# Patient Record
Sex: Female | Born: 1960 | Race: Black or African American | Hispanic: No | Marital: Married | Smoking: Never smoker
Health system: Southern US, Community
[De-identification: ages and names within clinical notes are randomized; demographics above are authoritative.]

## PROBLEM LIST (undated history)

## (undated) DIAGNOSIS — M1990 Unspecified osteoarthritis, unspecified site: Secondary | ICD-10-CM

## (undated) DIAGNOSIS — D649 Anemia, unspecified: Secondary | ICD-10-CM

## (undated) DIAGNOSIS — M810 Age-related osteoporosis without current pathological fracture: Secondary | ICD-10-CM

## (undated) DIAGNOSIS — H11009 Unspecified pterygium of unspecified eye: Secondary | ICD-10-CM

## (undated) DIAGNOSIS — I10 Essential (primary) hypertension: Secondary | ICD-10-CM

## (undated) HISTORY — DX: Essential (primary) hypertension: I10

## (undated) HISTORY — PX: MYOMECTOMY: SHX85

## (undated) HISTORY — PX: APPENDECTOMY: SHX54

## (undated) HISTORY — DX: Unspecified pterygium of unspecified eye: H11.009

---
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
## 1999-08-29 ENCOUNTER — Other Ambulatory Visit: Admission: RE | Admit: 1999-08-29 | Discharge: 1999-08-29 | Payer: Self-pay | Admitting: Obstetrics and Gynecology

## 2000-09-03 ENCOUNTER — Other Ambulatory Visit: Admission: RE | Admit: 2000-09-03 | Discharge: 2000-09-03 | Payer: Self-pay | Admitting: Obstetrics and Gynecology

## 2000-09-20 ENCOUNTER — Encounter: Payer: Self-pay | Admitting: Obstetrics and Gynecology

## 2000-09-20 ENCOUNTER — Ambulatory Visit (HOSPITAL_COMMUNITY): Admission: RE | Admit: 2000-09-20 | Discharge: 2000-09-20 | Payer: Self-pay | Admitting: *Deleted

## 2001-01-02 ENCOUNTER — Ambulatory Visit (HOSPITAL_COMMUNITY): Admission: RE | Admit: 2001-01-02 | Discharge: 2001-01-02 | Payer: Self-pay | Admitting: Obstetrics and Gynecology

## 2001-04-24 ENCOUNTER — Inpatient Hospital Stay (HOSPITAL_COMMUNITY): Admission: AD | Admit: 2001-04-24 | Discharge: 2001-04-24 | Payer: Self-pay | Admitting: Obstetrics and Gynecology

## 2001-06-24 ENCOUNTER — Inpatient Hospital Stay (HOSPITAL_COMMUNITY): Admission: AD | Admit: 2001-06-24 | Discharge: 2001-06-24 | Payer: Self-pay | Admitting: Obstetrics and Gynecology

## 2001-07-22 ENCOUNTER — Inpatient Hospital Stay (HOSPITAL_COMMUNITY): Admission: AD | Admit: 2001-07-22 | Discharge: 2001-07-22 | Payer: Self-pay | Admitting: Obstetrics and Gynecology

## 2001-09-06 ENCOUNTER — Inpatient Hospital Stay (HOSPITAL_COMMUNITY): Admission: AD | Admit: 2001-09-06 | Discharge: 2001-09-06 | Payer: Self-pay | Admitting: Obstetrics and Gynecology

## 2002-01-24 ENCOUNTER — Other Ambulatory Visit: Admission: RE | Admit: 2002-01-24 | Discharge: 2002-01-24 | Payer: Self-pay | Admitting: Obstetrics and Gynecology

## 2003-02-27 ENCOUNTER — Other Ambulatory Visit: Admission: RE | Admit: 2003-02-27 | Discharge: 2003-02-27 | Payer: Self-pay | Admitting: Obstetrics and Gynecology

## 2003-12-10 ENCOUNTER — Encounter: Admission: RE | Admit: 2003-12-10 | Discharge: 2003-12-10 | Payer: Self-pay | Admitting: Internal Medicine

## 2004-03-14 ENCOUNTER — Other Ambulatory Visit: Admission: RE | Admit: 2004-03-14 | Discharge: 2004-03-14 | Payer: Self-pay | Admitting: Obstetrics and Gynecology

## 2005-03-22 ENCOUNTER — Other Ambulatory Visit: Admission: RE | Admit: 2005-03-22 | Discharge: 2005-03-22 | Payer: Self-pay | Admitting: Obstetrics and Gynecology

## 2006-12-17 ENCOUNTER — Encounter: Admission: RE | Admit: 2006-12-17 | Discharge: 2006-12-17 | Payer: Self-pay | Admitting: Internal Medicine

## 2010-07-31 HISTORY — PX: COLONOSCOPY: SHX174

## 2010-11-25 LAB — HM COLONOSCOPY

## 2010-12-16 NOTE — H&P (Signed)
[facility]  Patient:    Dana Hays, Dana Hays                         MRN: [id number] Adm. Date:  01/02/01 Attending:  Duke Salvia. Marcelle Overlie, M.D.                         History and Physical  CHIEF COMPLAINT:              Primary infertility.  HISTORY OF PRESENT ILLNESS:   A 50 year old G0, P0 has been off OCPs since November 2000 without conception.  She has a history of a multiple myomectomy in 1995.  This was done primarily due to abnormal uterine bleeding.  At the time in 1995 she had a 3 cm intramural fibroid removed and two posterior wall 3 cm fibroids removed.  The adnexal anatomy at the time appeared to be normal. Since coming off OCPs and failing to conceive, she had a mid luteal screening progesterone of 14.7 and recently underwent HSG that showed bilateral proximal obstruction.  She is admitted now for diagnostic laparoscopy with tubal dye studies.  This procedure including risks of bleeding, infection, adjacent organ injury, the possible need for open or additional surgery all discussed with her.  Laparoscopic techniques for lysis of adhesions or treatment of endometriosis reviewed also.  ALLERGIES:                    None.  PAST SURGICAL HISTORY:        Myomectomy.  REVIEW OF SYSTEMS:            Otherwise unremarkable.  CURRENT MEDICATIONS:          None.  PHYSICAL EXAMINATION  VITAL SIGNS:                  Temperature 98.2, blood pressure 128/80.  HEENT:                        Unremarkable.  NECK:                         Supple without mass.  LUNGS:                        Clear.  CARDIOVASCULAR:               Regular rate and rhythm without murmurs, rubs, or gallops noted.  BREASTS:                      Without masses.  ABDOMEN:                      Soft, flat, nontender.  PELVIC:                       Normal external genitalia.  Vagina and cervix: Clear.  Uterus:  Mid position, normal size.  No new fibroids were  palpated. Adnexa:  Unremarkable.  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  Patient with a history of prior myomectomy in 1995.  PLAN:  Diagnostic laparoscopy with tubal dye studies. Procedure and risks discussed as above. DD:  01/01/01 TD:  01/01/01 Job: [id number]

## 2010-12-16 NOTE — Op Note (Signed)
[facility]  Patient:    Dana Hays, Dana Hays                         MRN: [id number] Proc. Date: 01/01/01 Adm. Date:  [id number] Attending:  Rhina Brackett                           Operative Report  PREOPERATIVE DIAGNOSIS:  Primary infertility.  POSTOPERATIVE DIAGNOSIS:  Primary infertility.  PROCEDURE:  Diagnostic laparoscopy with tubal dye study.  SURGEON:  Dr. Marcelle Overlie.  ANESTHESIA:  General endotracheal.  COMPLICATIONS:  None.  DRAINS:  In and out Foley catheter.  ESTIMATED BLOOD LOSS:  Less than 5 cc.  DESCRIPTION OF PROCEDURE:  The patient went to the operating room after an adequate level of general endotracheal anesthesia was obtained. With her legs in stirrups, the perineum and vagina were prepped and draped in the usual manner for a laparoscopy. The bladder was drained, EUA carried out. The uterus was mobile, normal size, mid position, adnexa negative. The ______ cannula was then positioned. Attention directed to the abdomen where a 2 cm subumbilical incision was made. The Veress needle was introduced without difficulty, its infraabdominal position was verified by pressure and water testing. After a 2 liter pneumoperitoneum was then created, laparoscopic trocar and ______ then introduced without difficulty. There was no evidence of any bleeding or trauma. Three to four finger breadths above the symphysis in the midline, a second contour was made a 5 mm trocar was inserted under direct visualization and a blunt probe was inserted. The patient was then placed in Trendelenburg and the pelvic findings as follows.  The anterior and posterior cul-de-sac spaces were free and clear, the uterus was felt and was normal size. There was no evidence of adhesions after her prior myomectomy. She had one new fibroid that was approximately a centimeter and a half on the right posterior fundal area, no others were noted. The right tube and ovary were  entirely normal. The fimbriated end was delicate without evidence of adhesions. Again the cul-de-sac was free and clear. There was no evidence of any adhesions or endometriosis. On the left side, the left ovary was normal but the left tube was somewhat tortuous although the fimbriated end appeared to be normal. She had had a prior appendectomy. The upper abdominal findings were normal. After these findings were noted, dilute methylene blue was insufflated. There was initial resistance to injection but this was quickly overcome and prompt spillage noted from both sides. Continued lavage was carried out with easy flow on both sides. Once patency was established on either side, the suction irrigator was used to irrigate the dilute methylene blue solution out. Once the irrigation was carried out, no further abnormal findings were noted. She did received Cefotan 1 gm IV preoperatively. Instruments were removed, gas allowed to escape, the defects closed with 4-0 Dexon subcuticular sutures. She tolerated this well and went to the recovery room in good condition. DD:  01/02/01 TD:  01/02/01 Job: [id number]

## 2016-11-20 LAB — HM PAP SMEAR: HM Pap smear: NEGATIVE

## 2018-05-09 ENCOUNTER — Ambulatory Visit (INDEPENDENT_AMBULATORY_CARE_PROVIDER_SITE_OTHER): Payer: 59 | Admitting: Family Medicine

## 2018-05-09 ENCOUNTER — Encounter: Payer: Self-pay | Admitting: Family Medicine

## 2018-05-09 VITALS — BP 140/82 | HR 68 | Temp 97.8°F | Ht 64.5 in | Wt 128.4 lb

## 2018-05-09 DIAGNOSIS — L42 Pityriasis rosea: Secondary | ICD-10-CM

## 2018-05-09 DIAGNOSIS — R21 Rash and other nonspecific skin eruption: Secondary | ICD-10-CM | POA: Diagnosis not present

## 2018-05-09 NOTE — Patient Instructions (Addendum)
Take a nondrowsy antihistamine such as Claritin, Xyzal, Allegra, or Zyrtec.  You can use Benadryl at night if needed.   Take cool baths or showers and use cool compresses as needed for itching.   Pityriasis Rosea Pityriasis rosea is a rash that usually appears on the trunk of the body. It may also appear on the upper arms and upper legs. It usually begins as a single patch, and then more patches begin to develop. The rash may cause mild itching, but it normally does not cause other problems. It usually goes away without treatment. However, it may take weeks or months for the rash to go away completely. What are the causes? The cause of this condition is not known. The condition does not spread from person to person (is noncontagious). What increases the risk? This condition is more likely to develop in young adults and children. It is most common in the spring and fall. What are the signs or symptoms? The main symptom of this condition is a rash.  The rash usually begins with a single oval patch that is larger than the ones that follow. This is called a herald patch. It generally appears a week or more before the rest of the rash appears.  When more patches start to develop, they spread quickly on the trunk, back, and arms. These patches are smaller than the first one.  The patches that make up the rash are usually oval-shaped and pink or red in color. They are usually flat, but they may sometimes be raised so that they can be felt with a finger. They may also be finely crinkled and have a scaly ring around the edge.  The rash does not typically appear on areas of the skin that are exposed to the sun.  Most people who have this condition do not have other symptoms, but some have mild itching. In a few cases, a mild headache or body aches may occur before the rash appears and then go away. How is this diagnosed? Your health care provider may diagnose this condition by doing a physical exam and  taking your medical history. To rule out other possible causes for the rash, the health care provider may order blood tests or take a skin sample from the rash to be looked at under a microscope. How is this treated? Usually, treatment is not needed for this condition. The rash will probably go away on its own in 4-8 weeks. In some cases, a health care provider may recommend or prescribe medicine to reduce itching. Follow these instructions at home:  Take medicines only as directed by your health care provider.  Avoid scratching the affected areas of skin.  Do not take hot baths or use a sauna. Use only warm water when bathing or showering. Heat can increase itching. Contact a health care provider if:  Your rash does not go away in 8 weeks.  Your rash gets much worse.  You have a fever.  You have swelling or pain in the rash area.  You have fluid, blood, or pus coming from the rash area. This information is not intended to replace advice given to you by your health care provider. Make sure you discuss any questions you have with your health care provider. Document Released: 08/23/2001 Document Revised: 12/23/2015 Document Reviewed: 06/24/2014 Elsevier Interactive Patient Education  [employer].

## 2018-05-09 NOTE — Progress Notes (Signed)
   Subjective:    Patient ID: Dana Hays, female    DOB: [birth year], 57 y.o.   MRN: [id number]  HPI Chief Complaint  Patient presents with  . new pt    rash for the last 2 months, used otc meds, then went to another provider to get meds.    She is new to the practice and here for an acute complaint.   Previous medical care: Dr. Lorene Dy  Complains of a rash started on her right thigh 2 months ago. She thought it was a mosquito bite. Since then she has noticed additional red bumps to her torso, upper and lower extremities and slightly pruritic.   States she has been evaluated by at least 2 other providers and has tried topical antihistamine, oral steroids, topical steroid and antifungal creams without any relief.   Denies fever, chills, headache, dizziness, chest pain, palpitations, abdominal pain, N/V/D. No arthralgias or myalgias.   Reviewed allergies, medications, past medical, surgical, family, and social history.     Review of Systems Pertinent positives and negatives in the history of present illness.     Objective:   Physical Exam BP 140/82   Pulse 68   Temp 97.8 F (36.6 C) (Oral)   Ht 5' 4.5" (1.638 m)   Wt 128 lb 6.4 oz (58.2 kg)   SpO2 98%   BMI 21.70 kg/m   Alert and oriented and in no acute distress. Multiple areas of oval lesions that are erythematous and scaly on torso and extremities. Rash on back is in a christmas tree type pattern. No sign of infection.       Assessment & Plan:  PR (pityriasis rosea)  Rash  Discussed that this appears to be related to a viral illness called pityriasis. Dr. Redmond School also examined patient. The initial patch on her thigh was most likely the herald patch. She has an upcoming appointment with a dermatologist and encouraged her to keep this appointment. Discussed trying a daily non drowsy antihistamine to help control itching and to take cool showers although she denies itching interfering with her daily activities  or sleep

## 2018-05-10 ENCOUNTER — Encounter: Payer: Self-pay | Admitting: Family Medicine

## 2018-07-15 ENCOUNTER — Telehealth: Payer: Self-pay | Admitting: Family Medicine

## 2018-07-15 NOTE — Telephone Encounter (Signed)
Called the office of Dr. Lorene Dy in inquire about records that we have been requesting since October. Those records are ready but their office policy requires the pt pick up at their office. They have attempted to reach pt. I also called and was unable to leave a message.

## 2018-12-25 ENCOUNTER — Telehealth: Payer: Self-pay | Admitting: Family Medicine

## 2018-12-25 LAB — HM DEXA SCAN

## 2018-12-25 NOTE — Telephone Encounter (Signed)
Left message for pt to call and schedule a wellness exam

## 2019-03-02 NOTE — Progress Notes (Signed)
Subjective:    Patient ID: Dana Hays, female    DOB: 03/17/1961, 58 y.o.   MRN: [id number]  HPI Chief Complaint  Patient presents with  . Annual Exam    concenrd bp medication may not be working    She is fairly new to the practice and here for a complete physical exam. Last CPE: 01/2018  Lorene Dy was her previous PCP.  She did bring in paper charting today from his office including lab results.  HTN- Concerned that her BP medication is not working.  States she has had hypertension for years and has been on the current lisinopril-HCTZ 10-12.5 mg dose the entire time.  Denies ever being on any other medication for hypertension.  Denies eating a lot of sodium.  Diclofenac for back pain, sporadically   Other providers: OB/GYN- Dr. Currie Paris  Social history: Lives with husband, works as  Denies smoking, drinking alcohol, drug use  Diet: healthy  Excerise: very active, bike riding   Immunizations: unknown.  She does have records from previous PCP and will review these for deficiencies.  Health maintenance:  Mammogram: May 2020 Colonoscopy: 2012 at [facility] and due for recall in 2022 Last Gynecological Exam: May 2020  Last Menstrual cycle: age 33  DEXA: May 2020  Last Dental Exam: July 2020 Last Eye Exam: years ago   Wears seatbelt always, smoke detectors in home and functioning, does not text while driving and feels safe in home environment.   Reviewed allergies, medications, past medical, surgical, family, and social history.   Review of Systems Review of Systems Constitutional: -fever, -chills, -sweats, -unexpected weight change,-fatigue ENT: -runny nose, -ear pain, -sore throat Cardiology:  -chest pain, -palpitations, -edema Respiratory: -cough, -shortness of breath, -wheezing Gastroenterology: -abdominal pain, -nausea, -vomiting, -diarrhea, -constipation  Hematology: -bleeding or bruising problems Musculoskeletal: -arthralgias, -myalgias, -joint  swelling, -back pain Ophthalmology: -vision changes Urology: -dysuria, -difficulty urinating, -hematuria, -urinary frequency, -urgency Neurology: -headache, -weakness, -tingling, -numbness       Objective:   Physical Exam BP (!) 132/98   Pulse 64   Temp 98.2 F (36.8 C) (Oral)   Ht 5\' 5"  (1.651 m)   Wt 123 lb 12.8 oz (56.2 kg)   SpO2 99%   BMI 20.60 kg/m   General Appearance:    Alert, cooperative, no distress, appears stated age  Head:    Normocephalic, without obvious abnormality, atraumatic  Eyes:    PERRL, conjunctiva/corneas clear, EOM's intact, fundi    benign  Ears:    Normal TM's and external ear canals  Nose:  Mask in place   Throat:  Mask in place   Neck:   Supple, no lymphadenopathy;  thyroid:  no   enlargement/tenderness/nodules; no carotid   bruit or JVD  Back:    Spine nontender, no curvature, ROM normal, no CVA     tenderness  Lungs:     Clear to auscultation bilaterally without wheezes, rales or     ronchi; respirations unlabored  Chest Wall:    No tenderness or deformity   Heart:    Regular rate and rhythm, S1 and S2 normal, no murmur, rub   or gallop  Breast Exam:    OB/GYN  Abdomen:     Soft, non-tender, nondistended, normoactive bowel sounds,    no masses, no hepatosplenomegaly  Genitalia:    OB/GYN     Extremities:   No clubbing, cyanosis or edema  Pulses:   2+ and symmetric all extremities  Skin:  Skin color, texture, turgor normal, no rashes or lesions  Lymph nodes:   Cervical, supraclavicular, and axillary nodes normal  Neurologic:   CNII-XII intact, normal strength, sensation and gait; reflexes 2+ and symmetric throughout          Psych:   Normal mood, affect, hygiene and grooming.    UA dipstick- negative      Assessment & Plan:  Routine general medical examination at a health care facility - Plan: CBC with Differential/Platelet, Comprehensive metabolic panel, TSH, T4, free, Lipid panel, POCT Urinalysis DIP (Proadvantage Device).  She is  here today for a fasting CPE and appears to be doing quite well.  She is taking good care of herself with a healthy diet and frequent exercise.  Reports being up-to-date with Pap smear, mammogram and bone density.  I will request these from her OB/GYN.  Immunization counseling done.  She will review immunizations from her previous PCP and let me know if she is overdue.  Essential hypertension - Plan: lisinopril-hydrochlorothiazide (ZESTORETIC) 20-12.5 MG tablet.  Her blood pressure has not been well controlled for some time.  She checks her blood pressure at home.  Counseling done on low-sodium diet.  She currently does exercise often and will keep this up.  I will increase her lisinopril dose from 10 mg to 20 mg and she will continue on 12.5 mg of HCTZ.  She will let me know if her blood pressures are not improving and will follow-up here in 4 weeks.

## 2019-03-03 ENCOUNTER — Encounter: Payer: Self-pay | Admitting: Family Medicine

## 2019-03-03 ENCOUNTER — Other Ambulatory Visit: Payer: Self-pay

## 2019-03-03 ENCOUNTER — Ambulatory Visit (INDEPENDENT_AMBULATORY_CARE_PROVIDER_SITE_OTHER): Payer: BC Managed Care – PPO | Admitting: Family Medicine

## 2019-03-03 VITALS — BP 132/98 | HR 64 | Temp 98.2°F | Ht 65.0 in | Wt 123.8 lb

## 2019-03-03 DIAGNOSIS — I10 Essential (primary) hypertension: Secondary | ICD-10-CM | POA: Insufficient documentation

## 2019-03-03 DIAGNOSIS — Z0000 Encounter for general adult medical examination without abnormal findings: Secondary | ICD-10-CM

## 2019-03-03 LAB — POCT URINALYSIS DIP (PROADVANTAGE DEVICE)
Bilirubin, UA: NEGATIVE
Blood, UA: NEGATIVE
Glucose, UA: NEGATIVE mg/dL
Ketones, POC UA: NEGATIVE mg/dL
Leukocytes, UA: NEGATIVE
Nitrite, UA: NEGATIVE
Protein Ur, POC: NEGATIVE mg/dL
Specific Gravity, Urine: 1.015
Urobilinogen, Ur: NEGATIVE
pH, UA: 6 (ref 5.0–8.0)

## 2019-03-03 MED ORDER — LISINOPRIL-HYDROCHLOROTHIAZIDE 20-12.5 MG PO TABS: 1.0000 | ORAL_TABLET | Freq: Every day | ORAL | 2 refills | Status: DC

## 2019-03-03 NOTE — Patient Instructions (Addendum)
Your BP is not well controlled. Goal BP is <130/80.   I am sending in a new prescription to your pharmacy and increasing your lisinopril dose from 10 mg to 20 mg.  Continue eating a low sodium diet and exercising.   Continue checking your BP at home.   Follow up in 4 weeks.    DASH Eating Plan DASH stands for "Dietary Approaches to Stop Hypertension." The DASH eating plan is a healthy eating plan that has been shown to reduce high blood pressure (hypertension). It may also reduce your risk for type 2 diabetes, heart disease, and stroke. The DASH eating plan may also help with weight loss. What are tips for following this plan?  General guidelines  Avoid eating more than 2,300 mg (milligrams) of salt (sodium) a day. If you have hypertension, you may need to reduce your sodium intake to 1,500 mg a day.  Limit alcohol intake to no more than 1 drink a day for nonpregnant women and 2 drinks a day for men. One drink equals 12 oz of beer, 5 oz of wine, or 1 oz of hard liquor.  Work with your health care provider to maintain a healthy body weight or to lose weight. Ask what an ideal weight is for you.  Get at least 30 minutes of exercise that causes your heart to beat faster (aerobic exercise) most days of the week. Activities may include walking, swimming, or biking.  Work with your health care provider or diet and nutrition specialist (dietitian) to adjust your eating plan to your individual calorie needs. Reading food labels   Check food labels for the amount of sodium per serving. Choose foods with less than 5 percent of the Daily Value of sodium. Generally, foods with less than 300 mg of sodium per serving fit into this eating plan.  To find whole grains, look for the word "whole" as the first word in the ingredient list. Shopping  Buy products labeled as "low-sodium" or "no salt added."  Buy fresh foods. Avoid canned foods and premade or frozen meals. Cooking  Avoid adding salt  when cooking. Use salt-free seasonings or herbs instead of table salt or sea salt. Check with your health care provider or pharmacist before using salt substitutes.  Do not fry foods. Cook foods using healthy methods such as baking, boiling, grilling, and broiling instead.  Cook with heart-healthy oils, such as olive, canola, soybean, or sunflower oil. Meal planning  Eat a balanced diet that includes: ? 5 or more servings of fruits and vegetables each day. At each meal, try to fill half of your plate with fruits and vegetables. ? Up to 6-8 servings of whole grains each day. ? Less than 6 oz of lean meat, poultry, or fish each day. A 3-oz serving of meat is about the same size as a deck of cards. One egg equals 1 oz. ? 2 servings of low-fat dairy each day. ? A serving of nuts, seeds, or beans 5 times each week. ? Heart-healthy fats. Healthy fats called Omega-3 fatty acids are found in foods such as flaxseeds and coldwater fish, like sardines, salmon, and mackerel.  Limit how much you eat of the following: ? Canned or prepackaged foods. ? Food that is high in trans fat, such as fried foods. ? Food that is high in saturated fat, such as fatty meat. ? Sweets, desserts, sugary drinks, and other foods with added sugar. ? Full-fat dairy products.  Do not salt foods before eating.  Try to eat at least 2 vegetarian meals each week.  Eat more home-cooked food and less restaurant, buffet, and fast food.  When eating at a restaurant, ask that your food be prepared with less salt or no salt, if possible. What foods are recommended? The items listed may not be a complete list. Talk with your dietitian about what dietary choices are Pillars for you. Grains Whole-grain or whole-wheat bread. Whole-grain or whole-wheat pasta. Brown rice. [place]. Bulgur. Whole-grain and low-sodium cereals. Pita bread. Low-fat, low-sodium crackers. Whole-wheat flour tortillas. Vegetables Fresh or frozen  vegetables (raw, steamed, roasted, or grilled). Low-sodium or reduced-sodium tomato and vegetable juice. Low-sodium or reduced-sodium tomato sauce and tomato paste. Low-sodium or reduced-sodium canned vegetables. Fruits All fresh, dried, or frozen fruit. Canned fruit in natural juice (without added sugar). Meat and other protein foods Skinless chicken or [place]. Ground chicken or [place]. Pork with fat trimmed off. Fish and seafood. Egg whites. Dried beans, peas, or lentils. Unsalted nuts, nut butters, and seeds. Unsalted canned beans. Lean cuts of beef with fat trimmed off. Low-sodium, lean deli meat. Dairy Low-fat (1%) or fat-free (skim) milk. Fat-free, low-fat, or reduced-fat cheeses. Nonfat, low-sodium ricotta or cottage cheese. Low-fat or nonfat yogurt. Low-fat, low-sodium cheese. Fats and oils Soft margarine without trans fats. Vegetable oil. Low-fat, reduced-fat, or light mayonnaise and salad dressings (reduced-sodium). Canola, safflower, olive, soybean, and sunflower oils. Avocado. Seasoning and other foods Herbs. Spices. Seasoning mixes without salt. Unsalted popcorn and pretzels. Fat-free sweets. What foods are not recommended? The items listed may not be a complete list. Talk with your dietitian about what dietary choices are [place] for you. Grains Baked goods made with fat, such as croissants, muffins, or some breads. Dry pasta or rice meal packs. Vegetables Creamed or fried vegetables. Vegetables in a cheese sauce. Regular canned vegetables (not low-sodium or reduced-sodium). Regular canned tomato sauce and paste (not low-sodium or reduced-sodium). Regular tomato and vegetable juice (not low-sodium or reduced-sodium). Angie Fava. Olives. Fruits Canned fruit in a light or heavy syrup. Fried fruit. Fruit in cream or butter sauce. Meat and other protein foods Fatty cuts of meat. Ribs. Fried meat. Berniece Salines. Sausage. Bologna and other processed lunch meats. Salami. Fatback. Hotdogs. Bratwurst.  Salted nuts and seeds. Canned beans with added salt. Canned or smoked fish. Whole eggs or egg yolks. Chicken or [place] with skin. Dairy Whole or 2% milk, cream, and half-and-half. Whole or full-fat cream cheese. Whole-fat or sweetened yogurt. Full-fat cheese. Nondairy creamers. Whipped toppings. Processed cheese and cheese spreads. Fats and oils Butter. Stick margarine. Lard. Shortening. Ghee. Bacon fat. Tropical oils, such as coconut, palm kernel, or palm oil. Seasoning and other foods Salted popcorn and pretzels. Onion salt, garlic salt, seasoned salt, table salt, and sea salt. Worcestershire sauce. Tartar sauce. Barbecue sauce. Teriyaki sauce. Soy sauce, including reduced-sodium. Steak sauce. Canned and packaged gravies. Fish sauce. Oyster sauce. Cocktail sauce. Horseradish that you find on the shelf. Ketchup. Mustard. Meat flavorings and tenderizers. Bouillon cubes. Hot sauce and Tabasco sauce. Premade or packaged marinades. Premade or packaged taco seasonings. Relishes. Regular salad dressings. Where to find more information:  National Heart, Lung, and [place]: https://wilson-eaton.com/  American Heart Association: www.heart.org Summary  The DASH eating plan is a healthy eating plan that has been shown to reduce high blood pressure (hypertension). It may also reduce your risk for type 2 diabetes, heart disease, and stroke.  With the DASH eating plan, you should limit salt (sodium) intake to 2,300 mg a day. If  you have hypertension, you may need to reduce your sodium intake to 1,500 mg a day.  When on the DASH eating plan, aim to eat more fresh fruits and vegetables, whole grains, lean proteins, low-fat dairy, and heart-healthy fats.  Work with your health care provider or diet and nutrition specialist (dietitian) to adjust your eating plan to your individual calorie needs. This information is not intended to replace advice given to you by your health care provider. Make sure you discuss any  questions you have with your health care provider. Document Released: 07/06/2011 Document Revised: 06/29/2017 Document Reviewed: 07/10/2016 Elsevier Patient Education  2020 Elsevier Inc.   Preventive Care 60-49 Years Old, Female Preventive care refers to visits with your health care provider and lifestyle choices that can promote health and wellness. This includes:  A yearly physical exam. This may also be called an annual well check.  Regular dental visits and eye exams.  Immunizations.  Screening for certain conditions.  Healthy lifestyle choices, such as eating a healthy diet, getting regular exercise, not using drugs or products that contain nicotine and tobacco, and limiting alcohol use. What can I expect for my preventive care visit? Physical exam Your health care provider will check your:  Height and weight. This may be used to calculate body mass index (BMI), which tells if you are at a healthy weight.  Heart rate and blood pressure.  Skin for abnormal spots. Counseling Your health care provider may ask you questions about your:  Alcohol, tobacco, and drug use.  Emotional well-being.  Home and relationship well-being.  Sexual activity.  Eating habits.  Work and work Statistician.  Method of birth control.  Menstrual cycle.  Pregnancy history. What immunizations do I need?  Influenza (flu) vaccine  This is recommended every year. Tetanus, diphtheria, and pertussis (Tdap) vaccine  You may need a Td booster every 10 years. Varicella (chickenpox) vaccine  You may need this if you have not been vaccinated. Zoster (shingles) vaccine  You may need this after age 78. Measles, mumps, and rubella (MMR) vaccine  You may need at least one dose of MMR if you were born in 1957 or later. You may also need a second dose. Pneumococcal conjugate (PCV13) vaccine  You may need this if you have certain conditions and were not previously vaccinated. Pneumococcal  polysaccharide (PPSV23) vaccine  You may need one or two doses if you smoke cigarettes or if you have certain conditions. Meningococcal conjugate (MenACWY) vaccine  You may need this if you have certain conditions. Hepatitis A vaccine  You may need this if you have certain conditions or if you travel or work in places where you may be exposed to hepatitis A. Hepatitis B vaccine  You may need this if you have certain conditions or if you travel or work in places where you may be exposed to hepatitis B. Haemophilus influenzae type b (Hib) vaccine  You may need this if you have certain conditions. Human papillomavirus (HPV) vaccine  If recommended by your health care provider, you may need three doses over 6 months. You may receive vaccines as individual doses or as more than one vaccine together in one shot (combination vaccines). Talk with your health care provider about the risks and benefits of combination vaccines. What tests do I need? Blood tests  Lipid and cholesterol levels. These may be checked every 5 years, or more frequently if you are over 45 years old.  Hepatitis C test.  Hepatitis B test. Screening  Lung cancer screening. You may have this screening every year starting at age 73 if you have a 30-pack-year history of smoking and currently smoke or have quit within the past 15 years.  Colorectal cancer screening. All adults should have this screening starting at age 47 and continuing until age 27. Your health care provider may recommend screening at age 66 if you are at increased risk. You will have tests every 1-10 years, depending on your results and the type of screening test.  Diabetes screening. This is done by checking your blood sugar (glucose) after you have not eaten for a while (fasting). You may have this done every 1-3 years.  Mammogram. This may be done every 1-2 years. Talk with your health care provider about when you should start having regular  mammograms. This may depend on whether you have a family history of breast cancer.  BRCA-related cancer screening. This may be done if you have a family history of breast, ovarian, tubal, or peritoneal cancers.  Pelvic exam and Pap test. This may be done every 3 years starting at age 74. Starting at age 27, this may be done every 5 years if you have a Pap test in combination with an HPV test. Other tests  Sexually transmitted disease (STD) testing.  Bone density scan. This is done to screen for osteoporosis. You may have this scan if you are at high risk for osteoporosis. Follow these instructions at home: Eating and drinking  Eat a diet that includes fresh fruits and vegetables, whole grains, lean protein, and low-fat dairy.  Take vitamin and mineral supplements as recommended by your health care provider.  Do not drink alcohol if: ? Your health care provider tells you not to drink. ? You are pregnant, may be pregnant, or are planning to become pregnant.  If you drink alcohol: ? Limit how much you have to 0-1 drink a day. ? Be aware of how much alcohol is in your drink. In the [place] one drink equals one 12 oz bottle of beer (355 mL), one 5 oz glass of wine (148 mL), or one 1 oz glass of hard liquor (44 mL). Lifestyle  Take daily care of your teeth and gums.  Stay active. Exercise for at least 30 minutes on 5 or more days each week.  Do not use any products that contain nicotine or tobacco, such as cigarettes, e-cigarettes, and chewing tobacco. If you need help quitting, ask your health care provider.  If you are sexually active, practice safe sex. Use a condom or other form of birth control (contraception) in order to prevent pregnancy and STIs (sexually transmitted infections).  If told by your health care provider, take low-dose aspirin daily starting at age 11. What's next?  Visit your health care provider once a year for a well check visit.  Ask your health care provider  how often you should have your eyes and teeth checked.  Stay up to date on all vaccines. This information is not intended to replace advice given to you by your health care provider. Make sure you discuss any questions you have with your health care provider. Document Released: 08/13/2015 Document Revised: 03/28/2018 Document Reviewed: 03/28/2018 Elsevier Patient Education  2020 [employer].

## 2019-03-04 ENCOUNTER — Encounter: Payer: Self-pay | Admitting: Family Medicine

## 2019-03-04 LAB — CBC WITH DIFFERENTIAL/PLATELET
Basophils Absolute: 0.1 10*3/uL (ref 0.0–0.2)
Basos: 1 %
EOS (ABSOLUTE): 0.1 10*3/uL (ref 0.0–0.4)
Eos: 1 %
Hematocrit: 39.8 % (ref 34.0–46.6)
Hemoglobin: 13.1 g/dL (ref 11.1–15.9)
Immature Grans (Abs): 0 10*3/uL (ref 0.0–0.1)
Immature Granulocytes: 0 %
Lymphocytes Absolute: 1.8 10*3/uL (ref 0.7–3.1)
Lymphs: 43 %
MCH: 30 pg (ref 26.6–33.0)
MCHC: 32.9 g/dL (ref 31.5–35.7)
MCV: 91 fL (ref 79–97)
Monocytes Absolute: 0.3 10*3/uL (ref 0.1–0.9)
Monocytes: 8 %
Neutrophils Absolute: 2 10*3/uL (ref 1.4–7.0)
Neutrophils: 47 %
Platelets: 207 10*3/uL (ref 150–450)
RBC: 4.37 x10E6/uL (ref 3.77–5.28)
RDW: 13.2 % (ref 11.7–15.4)
WBC: 4.2 10*3/uL (ref 3.4–10.8)

## 2019-03-04 LAB — COMPREHENSIVE METABOLIC PANEL
ALT: 13 IU/L (ref 0–32)
AST: 20 IU/L (ref 0–40)
Albumin/Globulin Ratio: 1.8 (ref 1.2–2.2)
Albumin: 4.7 g/dL (ref 3.8–4.9)
Alkaline Phosphatase: 71 IU/L (ref 39–117)
BUN/Creatinine Ratio: 18 (ref 9–23)
BUN: 17 mg/dL (ref 6–24)
Bilirubin Total: 0.4 mg/dL (ref 0.0–1.2)
CO2: 25 mmol/L (ref 20–29)
Calcium: 9.5 mg/dL (ref 8.7–10.2)
Chloride: 102 mmol/L (ref 96–106)
Creatinine, Ser: 0.92 mg/dL (ref 0.57–1.00)
GFR calc Af Amer: 79 mL/min/{1.73_m2} (ref >59)
GFR calc non Af Amer: 69 mL/min/{1.73_m2} (ref >59)
Globulin, Total: 2.6 g/dL (ref 1.5–4.5)
Glucose: 87 mg/dL (ref 65–99)
Potassium: 3.9 mmol/L (ref 3.5–5.2)
Sodium: 140 mmol/L (ref 134–144)
Total Protein: 7.3 g/dL (ref 6.0–8.5)

## 2019-03-04 LAB — LIPID PANEL
Chol/HDL Ratio: 2.4 ratio (ref 0.0–4.4)
Cholesterol, Total: 178 mg/dL (ref 100–199)
HDL: 75 mg/dL (ref >39)
LDL Calculated: 93 mg/dL (ref 0–99)
Triglycerides: 51 mg/dL (ref 0–149)
VLDL Cholesterol Cal: 10 mg/dL (ref 5–40)

## 2019-03-04 LAB — T4, FREE: Free T4: 1.23 ng/dL (ref 0.82–1.77)

## 2019-03-04 LAB — TSH: TSH: 0.504 u[IU]/mL (ref 0.450–4.500)

## 2019-03-17 ENCOUNTER — Encounter: Payer: Self-pay | Admitting: Family Medicine

## 2019-03-17 DIAGNOSIS — M810 Age-related osteoporosis without current pathological fracture: Secondary | ICD-10-CM

## 2019-03-17 HISTORY — DX: Age-related osteoporosis without current pathological fracture: M81.0

## 2019-03-26 ENCOUNTER — Encounter: Payer: Self-pay | Admitting: Internal Medicine

## 2019-03-26 ENCOUNTER — Telehealth: Payer: Self-pay | Admitting: Family Medicine

## 2019-03-26 NOTE — Telephone Encounter (Signed)
Records received from [facility] 

## 2019-03-31 ENCOUNTER — Encounter: Payer: Self-pay | Admitting: Internal Medicine

## 2019-03-31 ENCOUNTER — Ambulatory Visit (INDEPENDENT_AMBULATORY_CARE_PROVIDER_SITE_OTHER): Payer: BC Managed Care – PPO | Admitting: Family Medicine

## 2019-03-31 ENCOUNTER — Other Ambulatory Visit: Payer: Self-pay

## 2019-03-31 ENCOUNTER — Encounter: Payer: Self-pay | Admitting: Family Medicine

## 2019-03-31 VITALS — BP 130/80 | HR 62 | Wt 125.8 lb

## 2019-03-31 DIAGNOSIS — I10 Essential (primary) hypertension: Secondary | ICD-10-CM | POA: Diagnosis not present

## 2019-03-31 NOTE — Patient Instructions (Signed)

## 2019-03-31 NOTE — Progress Notes (Signed)
   Subjective:    Patient ID: Dana Hays, female    DOB: [birth year], 58 y.o.   MRN: [id number]  HPI Chief Complaint  Patient presents with  . 4 week follow-up    4 week follow-up on BP, bp has running in 130/80 but has come down to 120's   Here today to follow up on HTN. Increased dose of lisinopril on 03/03/2019 due to elevated BP.  BP at home has been in the 120s-130s/70s-80s. No concerns with the medications.   Diet is fairly high in sodium.   Only taking diclofenac sparingly.   Drinks energy drinks 3-4 days a week. She does this before her long bike rides. Bikes 50+ miles usually.   Denies fever, chills, headache, vision changes, dizziness, chest pain, palpitations, shortness of breath, abdominal pain, N/V/D, LE edema.   States she had an EKG last year with her previous PCP, Dr. Mancel Bale. This was sent for scanning but unable to find it today. She reports it being normal.   Will see Dr. Matthew Saras for osteoporosis follow up.   Reviewed allergies, medications, past medical, surgical, family, and social history.   Review of Systems Pertinent positives and negatives in the history of present illness.     Objective:   Physical Exam BP 130/80   Pulse 62   Wt 125 lb 12.8 oz (57.1 kg)   BMI 20.93 kg/m   Alert and oriented and in no acute distress.  Extremities without edema.  Mood is pleasant.  Not otherwise examined.      Assessment & Plan:  Essential hypertension  Blood pressure is better controlled since increasing lisinopril.  Denies any concerns with blood pressure medications.  She is doing a good job checking her blood pressure at home.  Suspect her diet is high in sodium and she will pay closer attention to nutrition labels. Reviewed labs which were all normal from her CPE 4 weeks ago. Unable to locate EKG which was done at her previous PCP at last years physical.  She reports it was normal and I do recall seeing this however, it was sent to be scanned in and  unable to be found in the EMR. No new concerns today.  She will follow-up with her OB/GYN who recently ordered a DEXA which showed osteoporosis. Follow-up in 6 months for hypertension and med check.  I will request EKG from Dr. Mancel Bale office.  Repeat at follow-up if appropriate

## 2019-05-28 ENCOUNTER — Encounter: Payer: Self-pay | Admitting: Family Medicine

## 2019-05-28 DIAGNOSIS — I10 Essential (primary) hypertension: Secondary | ICD-10-CM

## 2019-05-28 MED ORDER — LISINOPRIL-HYDROCHLOROTHIAZIDE 20-12.5 MG PO TABS: 1.0000 | ORAL_TABLET | Freq: Every day | ORAL | 2 refills | Status: DC

## 2019-09-04 ENCOUNTER — Ambulatory Visit (INDEPENDENT_AMBULATORY_CARE_PROVIDER_SITE_OTHER): Payer: BC Managed Care – PPO | Admitting: Family Medicine

## 2019-09-04 ENCOUNTER — Encounter: Payer: Self-pay | Admitting: Family Medicine

## 2019-09-04 ENCOUNTER — Other Ambulatory Visit: Payer: Self-pay

## 2019-09-04 VITALS — BP 150/90 | HR 63 | Temp 97.5°F | Wt 124.0 lb

## 2019-09-04 DIAGNOSIS — I10 Essential (primary) hypertension: Secondary | ICD-10-CM

## 2019-09-04 HISTORY — DX: Essential (primary) hypertension: I10

## 2019-09-04 MED ORDER — AMLODIPINE BESYLATE 5 MG PO TABS: 5.0000 mg | ORAL_TABLET | Freq: Every day | ORAL | 2 refills | Status: DC

## 2019-09-04 MED ORDER — LISINOPRIL-HYDROCHLOROTHIAZIDE 20-12.5 MG PO TABS: 1.0000 | ORAL_TABLET | Freq: Every day | ORAL | 2 refills | Status: DC

## 2019-09-04 NOTE — Progress Notes (Signed)
   Subjective:    Patient ID: Dana Hays, female    DOB: [birth year], 59 y.o.   MRN: [id number]  HPI Chief Complaint  Patient presents with  . med check    med check   Here for a follow up on HTN.  States she does not think her blood pressure medication is working.  States she has been out of her medication for a couple of days.   BP this morning 142/97, 136/92, 137/92   Drinks an energy drink daily. States she eats a healthy diet, not high in sodium.  She exercises regularly.   Denies fever, chills, dizziness, chest pain, palpitations, shortness of breath, abdominal pain, N/V/D, urinary symptoms, LE edema.     Review of Systems Pertinent positives and negatives in the history of present illness.     Objective:   Physical Exam BP (!) 150/90   Pulse 63   Temp (!) 97.5 F (36.4 C)   Wt 124 lb (56.2 kg)   BMI 20.63 kg/m   Alert and oriented and in no distress. Tympanic membranes and canals are normal. Pharyngeal area is normal. Neck is supple without adenopathy or thyromegaly. Cardiac exam shows a regular sinus rhythm without murmurs or gallops. Lungs are clear to auscultation.       Assessment & Plan:  Uncontrolled hypertension - Plan: EKG 12-Lead, amLODipine (NORVASC) 5 MG tablet, lisinopril-hydrochlorothiazide (ZESTORETIC) 20-12.5 MG tablet, CBC with Differential/Platelet, Comprehensive metabolic panel   EKG shows NSR, sinus brady with vent rate 56. Unremarkable. Read by Dr. Redmond School and myself.  She reports feeling in her usual state of health.  Blood pressure is not well controlled.  I agree with her that her current medication regimen is not working.  She will continue on lisinopril/HCTZ and we will add amlodipine 5 mg. She appears to be taking good care of herself with healthy diet and exercise.  Basically a low-sodium diet.  She does drink an energy drink daily.  She may cut back to every other day with this and see if it helps.  She has a strong family history  of hypertension. She will keep a close eye on her blood pressures over the next couple of weeks and send me a list of her readings.  In 4 weeks if her blood pressures are not controlled on her current medication regimen, I will consider cardiology referral to help [place] with better control.

## 2019-09-04 NOTE — Patient Instructions (Signed)
Continue on your current medication and start the new prescription called amlodipine.  Keep a close eye on your blood pressures and send me a MyChart message with your readings after 1 to 2 weeks.  Our goal is to get your blood pressure to 130/80 or less.   We will be in touch with your lab results.      DASH Eating Plan DASH stands for "Dietary Approaches to Stop Hypertension." The DASH eating plan is a healthy eating plan that has been shown to reduce high blood pressure (hypertension). It may also reduce your risk for type 2 diabetes, heart disease, and stroke. The DASH eating plan may also help with weight loss. What are tips for following this plan?  General guidelines  Avoid eating more than 2,300 mg (milligrams) of salt (sodium) a day. If you have hypertension, you may need to reduce your sodium intake to 1,500 mg a day.  Limit alcohol intake to no more than 1 drink a day for nonpregnant women and 2 drinks a day for men. One drink equals 12 oz of beer, 5 oz of wine, or 1 oz of hard liquor.  Work with your health care provider to maintain a healthy body weight or to lose weight. Ask what an ideal weight is for you.  Get at least 30 minutes of exercise that causes your heart to beat faster (aerobic exercise) most days of the week. Activities may include walking, swimming, or biking.  Work with your health care provider or diet and nutrition specialist (dietitian) to adjust your eating plan to your individual calorie needs. Reading food labels   Check food labels for the amount of sodium per serving. Choose foods with less than 5 percent of the Daily Value of sodium. Generally, foods with less than 300 mg of sodium per serving fit into this eating plan.  To find whole grains, look for the word "whole" as the first word in the ingredient list. Shopping  Buy products labeled as "low-sodium" or "no salt added."  Buy fresh foods. Avoid canned foods and premade or frozen  meals. Cooking  Avoid adding salt when cooking. Use salt-free seasonings or herbs instead of table salt or sea salt. Check with your health care provider or pharmacist before using salt substitutes.  Do not fry foods. Cook foods using healthy methods such as baking, boiling, grilling, and broiling instead.  Cook with heart-healthy oils, such as olive, canola, soybean, or sunflower oil. Meal planning  Eat a balanced diet that includes: ? 5 or more servings of fruits and vegetables each day. At each meal, try to fill half of your plate with fruits and vegetables. ? Up to 6-8 servings of whole grains each day. ? Less than 6 oz of lean meat, poultry, or fish each day. A 3-oz serving of meat is about the same size as a deck of cards. One egg equals 1 oz. ? 2 servings of low-fat dairy each day. ? A serving of nuts, seeds, or beans 5 times each week. ? Heart-healthy fats. Healthy fats called Omega-3 fatty acids are found in foods such as flaxseeds and coldwater fish, like sardines, salmon, and mackerel.  Limit how much you eat of the following: ? Canned or prepackaged foods. ? Food that is high in trans fat, such as fried foods. ? Food that is high in saturated fat, such as fatty meat. ? Sweets, desserts, sugary drinks, and other foods with added sugar. ? Full-fat dairy products.  Do not salt  foods before eating.  Try to eat at least 2 vegetarian meals each week.  Eat more home-cooked food and less restaurant, buffet, and fast food.  When eating at a restaurant, ask that your food be prepared with less salt or no salt, if possible. What foods are recommended? The items listed may not be a complete list. Talk with your dietitian about what dietary choices are [place] for you. Grains Whole-grain or whole-wheat bread. Whole-grain or whole-wheat pasta. Brown rice. [place]. Bulgur. Whole-grain and low-sodium cereals. Pita bread. Low-fat, low-sodium crackers. Whole-wheat flour  tortillas. Vegetables Fresh or frozen vegetables (raw, steamed, roasted, or grilled). Low-sodium or reduced-sodium tomato and vegetable juice. Low-sodium or reduced-sodium tomato sauce and tomato paste. Low-sodium or reduced-sodium canned vegetables. Fruits All fresh, dried, or frozen fruit. Canned fruit in natural juice (without added sugar). Meat and other protein foods Skinless chicken or [place]. Ground chicken or [place]. Pork with fat trimmed off. Fish and seafood. Egg whites. Dried beans, peas, or lentils. Unsalted nuts, nut butters, and seeds. Unsalted canned beans. Lean cuts of beef with fat trimmed off. Low-sodium, lean deli meat. Dairy Low-fat (1%) or fat-free (skim) milk. Fat-free, low-fat, or reduced-fat cheeses. Nonfat, low-sodium ricotta or cottage cheese. Low-fat or nonfat yogurt. Low-fat, low-sodium cheese. Fats and oils Soft margarine without trans fats. Vegetable oil. Low-fat, reduced-fat, or light mayonnaise and salad dressings (reduced-sodium). Canola, safflower, olive, soybean, and sunflower oils. Avocado. Seasoning and other foods Herbs. Spices. Seasoning mixes without salt. Unsalted popcorn and pretzels. Fat-free sweets. What foods are not recommended? The items listed may not be a complete list. Talk with your dietitian about what dietary choices are [place] for you. Grains Baked goods made with fat, such as croissants, muffins, or some breads. Dry pasta or rice meal packs. Vegetables Creamed or fried vegetables. Vegetables in a cheese sauce. Regular canned vegetables (not low-sodium or reduced-sodium). Regular canned tomato sauce and paste (not low-sodium or reduced-sodium). Regular tomato and vegetable juice (not low-sodium or reduced-sodium). Angie Fava. Olives. Fruits Canned fruit in a light or heavy syrup. Fried fruit. Fruit in cream or butter sauce. Meat and other protein foods Fatty cuts of meat. Ribs. Fried meat. Berniece Salines. Sausage. Bologna and other processed lunch meats.  Salami. Fatback. Hotdogs. Bratwurst. Salted nuts and seeds. Canned beans with added salt. Canned or smoked fish. Whole eggs or egg yolks. Chicken or [place] with skin. Dairy Whole or 2% milk, cream, and half-and-half. Whole or full-fat cream cheese. Whole-fat or sweetened yogurt. Full-fat cheese. Nondairy creamers. Whipped toppings. Processed cheese and cheese spreads. Fats and oils Butter. Stick margarine. Lard. Shortening. Ghee. Bacon fat. Tropical oils, such as coconut, palm kernel, or palm oil. Seasoning and other foods Salted popcorn and pretzels. Onion salt, garlic salt, seasoned salt, table salt, and sea salt. Worcestershire sauce. Tartar sauce. Barbecue sauce. Teriyaki sauce. Soy sauce, including reduced-sodium. Steak sauce. Canned and packaged gravies. Fish sauce. Oyster sauce. Cocktail sauce. Horseradish that you find on the shelf. Ketchup. Mustard. Meat flavorings and tenderizers. Bouillon cubes. Hot sauce and Tabasco sauce. Premade or packaged marinades. Premade or packaged taco seasonings. Relishes. Regular salad dressings. Where to find more information:  National Heart, Lung, and [place]: https://wilson-eaton.com/  American Heart Association: www.heart.org Summary  The DASH eating plan is a healthy eating plan that has been shown to reduce high blood pressure (hypertension). It may also reduce your risk for type 2 diabetes, heart disease, and stroke.  With the DASH eating plan, you should limit salt (sodium) intake to 2,300  mg a day. If you have hypertension, you may need to reduce your sodium intake to 1,500 mg a day.  When on the DASH eating plan, aim to eat more fresh fruits and vegetables, whole grains, lean proteins, low-fat dairy, and heart-healthy fats.  Work with your health care provider or diet and nutrition specialist (dietitian) to adjust your eating plan to your individual calorie needs. This information is not intended to replace advice given to you by your health  care provider. Make sure you discuss any questions you have with your health care provider. Document Revised: 06/29/2017 Document Reviewed: 07/10/2016 Elsevier Patient Education  2020 [employer].

## 2019-09-05 LAB — COMPREHENSIVE METABOLIC PANEL
ALT: 9 IU/L (ref 0–32)
AST: 17 IU/L (ref 0–40)
Albumin/Globulin Ratio: 2.1 (ref 1.2–2.2)
Albumin: 4.6 g/dL (ref 3.8–4.9)
Alkaline Phosphatase: 58 IU/L (ref 39–117)
BUN/Creatinine Ratio: 14 (ref 9–23)
BUN: 12 mg/dL (ref 6–24)
Bilirubin Total: 0.3 mg/dL (ref 0.0–1.2)
CO2: 23 mmol/L (ref 20–29)
Calcium: 9 mg/dL (ref 8.7–10.2)
Chloride: 103 mmol/L (ref 96–106)
Creatinine, Ser: 0.86 mg/dL (ref 0.57–1.00)
GFR calc Af Amer: 86 mL/min/{1.73_m2} (ref >59)
GFR calc non Af Amer: 74 mL/min/{1.73_m2} (ref >59)
Globulin, Total: 2.2 g/dL (ref 1.5–4.5)
Glucose: 77 mg/dL (ref 65–99)
Potassium: 3.9 mmol/L (ref 3.5–5.2)
Sodium: 138 mmol/L (ref 134–144)
Total Protein: 6.8 g/dL (ref 6.0–8.5)

## 2019-09-05 LAB — CBC WITH DIFFERENTIAL/PLATELET
Basophils Absolute: 0.1 10*3/uL (ref 0.0–0.2)
Basos: 1 %
EOS (ABSOLUTE): 0.1 10*3/uL (ref 0.0–0.4)
Eos: 1 %
Hematocrit: 39 % (ref 34.0–46.6)
Hemoglobin: 13 g/dL (ref 11.1–15.9)
Immature Grans (Abs): 0 10*3/uL (ref 0.0–0.1)
Immature Granulocytes: 0 %
Lymphocytes Absolute: 1.8 10*3/uL (ref 0.7–3.1)
Lymphs: 44 %
MCH: 29.9 pg (ref 26.6–33.0)
MCHC: 33.3 g/dL (ref 31.5–35.7)
MCV: 90 fL (ref 79–97)
Monocytes Absolute: 0.3 10*3/uL (ref 0.1–0.9)
Monocytes: 8 %
Neutrophils Absolute: 1.9 10*3/uL (ref 1.4–7.0)
Neutrophils: 46 %
Platelets: 197 10*3/uL (ref 150–450)
RBC: 4.35 x10E6/uL (ref 3.77–5.28)
RDW: 12.8 % (ref 11.7–15.4)
WBC: 4.1 10*3/uL (ref 3.4–10.8)

## 2019-09-19 ENCOUNTER — Encounter: Payer: Self-pay | Admitting: Family Medicine

## 2019-09-25 ENCOUNTER — Ambulatory Visit: Payer: BC Managed Care – PPO | Attending: Internal Medicine

## 2019-09-25 DIAGNOSIS — Z23 Encounter for immunization: Secondary | ICD-10-CM | POA: Insufficient documentation

## 2019-09-25 NOTE — Progress Notes (Signed)
   Covid-19 Vaccination Clinic  Name:  Dana Hays    MRN: [id number] DOB: [birth year]  09/25/2019  [name] was observed post Covid-19 immunization for 15 minutes without incidence. She was provided with Vaccine Information Sheet and instruction to access the V-Safe system.   [name] was instructed to call 911 with any severe reactions post vaccine: Marland Kitchen Difficulty breathing  . Swelling of your face and throat  . A fast heartbeat  . A bad rash all over your body  . Dizziness and weakness    Immunizations Administered    Name Date Dose VIS Date Route   Pfizer COVID-19 Vaccine 09/25/2019 12:43 PM 0.3 mL 07/11/2019 Intramuscular   Manufacturer: [place]   Lot: [id number]   [place]: [id number]

## 2019-10-02 ENCOUNTER — Ambulatory Visit (INDEPENDENT_AMBULATORY_CARE_PROVIDER_SITE_OTHER): Payer: BC Managed Care – PPO | Admitting: Family Medicine

## 2019-10-02 ENCOUNTER — Other Ambulatory Visit: Payer: Self-pay

## 2019-10-02 ENCOUNTER — Encounter: Payer: Self-pay | Admitting: Family Medicine

## 2019-10-02 VITALS — BP 129/89 | HR 77 | Temp 97.8°F | Wt 125.0 lb

## 2019-10-02 DIAGNOSIS — I10 Essential (primary) hypertension: Secondary | ICD-10-CM

## 2019-10-02 NOTE — Progress Notes (Signed)
   Subjective:  Documentation for virtual audio and video telecommunications through [place] encounter:  The patient was located in her car. 2 patient identifiers used.  The provider was located in the office. The patient did consent to this visit and is aware of possible charges through their insurance for this visit.  The other persons participating in this telemedicine service were none.    Patient ID: Dana Hays, female    DOB: 11/23/1960, 59 y.o.   MRN: [id number]  HPI Chief Complaint  Patient presents with  . follow-up    follow-up on HTN, coming down alot and seeing improvement   This is a 4 week follow up on HTN.   Her blood pressure was not controlled with lisinopril- HCTZ 20-12.5 mg so we added amlodipine 5 mg 4 weeks ago. States she is doing well with the medication and no side effects.  She keeps an eye on her blood pressure at home and her blood pressures have been trending downward.  She is taking good care of herself with healthy diet and regular exercise. No new concerns.  Review of Systems Pertinent positives and negatives in the history of present illness.     Objective:   Physical Exam BP 129/89   Pulse 77   Temp 97.8 F (36.6 C)   Wt 125 lb (56.7 kg)   BMI 20.80 kg/m   Alert and oriented in no acute distress.  She is in good spirits      Assessment & Plan:  Essential hypertension  Continue on current medication regimen for hypertension including lisinopril/HCTZ and amlodipine.  Continue checking blood pressures at home and she will let me know if her readings are not in goal range. Continue with healthy diet and exercise.  Time spent on call was 7 minutes and in review of previous records 10 minutes total.  This virtual service is not related to other E/M service within previous 7 days.

## 2019-10-21 ENCOUNTER — Ambulatory Visit: Payer: BC Managed Care – PPO | Attending: Internal Medicine

## 2019-10-21 DIAGNOSIS — Z23 Encounter for immunization: Secondary | ICD-10-CM

## 2019-10-21 NOTE — Progress Notes (Signed)
   Covid-19 Vaccination Clinic  Name:  Dana Hays    MRN: [id number] DOB: 11-05-1960  10/21/2019  [name] was observed post Covid-19 immunization for 15 minutes without incident. She was provided with Vaccine Information Sheet and instruction to access the V-Safe system.   [name] was instructed to call 911 with any severe reactions post vaccine: Marland Kitchen Difficulty breathing  . Swelling of face and throat  . A fast heartbeat  . A bad rash all over body  . Dizziness and weakness   Immunizations Administered    Name Date Dose VIS Date Route   Pfizer COVID-19 Vaccine 10/21/2019  1:44 PM 0.3 mL 07/11/2019 Intramuscular   Manufacturer: [place]   Lot: [id number]   [place]: [id number]

## 2019-12-09 ENCOUNTER — Encounter: Payer: Self-pay | Admitting: Family Medicine

## 2019-12-09 DIAGNOSIS — I10 Essential (primary) hypertension: Secondary | ICD-10-CM

## 2019-12-09 MED ORDER — LISINOPRIL-HYDROCHLOROTHIAZIDE 20-12.5 MG PO TABS: 1.0000 | ORAL_TABLET | Freq: Every day | ORAL | 3 refills | Status: DC

## 2019-12-09 MED ORDER — AMLODIPINE BESYLATE 5 MG PO TABS: 5.0000 mg | ORAL_TABLET | Freq: Every day | ORAL | 3 refills | Status: DC

## 2020-01-07 LAB — HM MAMMOGRAPHY

## 2020-03-09 NOTE — Progress Notes (Signed)
Subjective:    Patient ID: Dana Hays, female    DOB: 1960-12-16, 59 y.o.   MRN: [id number]  HPI Chief Complaint  Patient presents with  . fasting cpe    fasitng cpe, no pap. sees obgyn   She is here for a complete physical exam and to follow-up on chronic health conditions.   Other providers: OB/GYN- Dr. Matthew Saras Dermatologist  HTN- taking mediations without any issues.  BP at home has been in normal range.   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 Dull ache vs discomfort.   Wakes up with the pain. No pain with biking or during the day with her usual activities. Takes a Manufacturing systems engineer Aspirin   Osteoporosis- managed by Dr. Matthew Saras. Taking Boniva.   Hepatitis C screening - never HIV screening - never   Social history: Lives with her husband, works PT as at a middle school. Retired from [employer] Denies smoking, drinking alcohol, drug use  Diet: healthy but does love snacks such as chips  Excerise: daily   Immunizations: Tdap 2016 per patient with [place] maintenance:  Mammogram: June 2021 Colonoscopy: 10/2010 and due for 10 yr recall in 2022 Last Gynecological Exam: June 2021  Last Dental Exam: December 2020  Last Eye Exam: years ago   Wears seatbelt always, smoke detectors in home and functioning, does not text while driving and feels safe in home environment.   Reviewed allergies, medications, past medical, surgical, family, and social history.   Review of Systems Review of Systems Constitutional: -fever, -chills, -sweats, -unexpected weight change,-fatigue ENT: -runny nose, -ear pain, -sore throat Cardiology:  -chest pain, -palpitations, -edema Respiratory: -cough, -shortness of breath, -wheezing Gastroenterology: -abdominal pain, -nausea, -vomiting, -diarrhea, -constipation  Hematology: -bleeding or bruising problems Musculoskeletal: -arthralgias, -myalgias, -joint swelling,  -back pain Ophthalmology: -vision changes Urology: -dysuria, -difficulty urinating, -hematuria, -urinary frequency, -urgency Neurology: -headache, -weakness, -tingling, -numbness       Objective:   Physical Exam BP 120/70   Pulse (!) 57   Ht 5' 4.5" (1.638 m)   Wt 122 lb (55.3 kg)   BMI 20.62 kg/m   General Appearance:    Alert, cooperative, no distress, appears stated age  Head:    Normocephalic, without obvious abnormality, atraumatic  Eyes:    PERRL, conjunctiva/corneas clear, EOM's intact  Ears:    Normal TM's and external ear canals  Nose:   Mask in place   Throat:   Mask in place   Neck:   Supple, no lymphadenopathy;  thyroid:  no   enlargement/tenderness/nodules; no JVD  Back:    Spine nontender, no curvature, ROM normal, no CVA     tenderness  Lungs:     Clear to auscultation bilaterally without wheezes, rales or     ronchi; respirations unlabored  Chest Wall:    No tenderness or deformity   Heart:    Regular rate and rhythm, S1 and S2 normal, no murmur, rub   or gallop  Breast Exam:    OB/GYN  Abdomen:     Soft, non-tender, nondistended, normoactive bowel sounds,    no masses, no hepatosplenomegaly  Genitalia:    OB/GYN     Extremities:   No clubbing, cyanosis or edema  Pulses:   2+ and symmetric all extremities  Skin:   Skin color, texture, turgor normal, no rashes or lesions  Lymph nodes:  Cervical, supraclavicular, and axillary nodes normal  Neurologic:   CNII-XII intact, normal strength, sensation and gait; reflexes 2+ and symmetric throughout          Psych:   Normal mood, affect, hygiene and grooming.          Assessment & Plan:  Routine general medical examination at a health care facility - Plan: CBC with Differential/Platelet, Comprehensive metabolic panel, TSH, T4, free, Lipid panel, POCT Urinalysis DIP (Proadvantage Device) -She is here for a CPE.  Preventive health care reviewed.  She sees her OB/GYN, Dr. Matthew Saras, and is up-to-date on mammogram and  Pap smear as well as bone density.  Colonoscopy was 9 years ago in which she will be due for a recall next year.  Stool cards were given to screen today.  Urinalysis normal.  Immunizations reviewed.  Up-to-date on Covid vaccine.  Tdap up-to-date per patient.  She appears to be taking good care of herself with healthy diet and exercise.  Discussed safety and health promotion.  Age-related osteoporosis without current pathological fracture - Plan: VITAMIN D 25 Hydroxy (Vit-D Deficiency, Fractures) -Dr. Matthew Saras is managing this.  She is taking Boniva.  She is also taking calcium and vitamin D3.  She gets plenty of weightbearing exercises.  Essential hypertension - Plan: CBC with Differential/Platelet, Comprehensive metabolic panel -Blood pressure is well controlled.  Continue on current medication regimen.  Continue with healthy diet and exercise  Muscle strain of right upper back, initial encounter -Exam unremarkable.  The discomfort she reports appears to be in her right latissimus dorsi.  Recommend she keep an eye on this and if it is still bothering her in the next couple of weeks, she will let me know.  Medication management - Plan: VITAMIN D 25 Hydroxy (Vit-D Deficiency, Fractures) -She is taking 1600 IUs of vitamin D3 daily.  Check vitamin D level and adjust dose as appropriate.  Discussed how vitamin D works with calcium to help her bones.  Screening for thyroid disorder - Plan: TSH, T4, free -Follow-up pending results.  Screening for lipid disorders - Plan: Lipid panel -Follow-up pending results.  She does eat a healthy diet and exercises regularly.  Screen for colon cancer - Plan: POCT occult blood stool -Follow-up pending results.  She is due for 10-year recall with GI next year  Need for hepatitis C screening test - Plan: Hepatitis C antibody -Done per screening guidelines.  Screening for HIV (human immunodeficiency virus) - Plan: HIV Antibody (routine testing w rflx) -Done per  screening guidelines.

## 2020-03-09 NOTE — Patient Instructions (Signed)

## 2020-03-10 ENCOUNTER — Ambulatory Visit (INDEPENDENT_AMBULATORY_CARE_PROVIDER_SITE_OTHER): Payer: BC Managed Care – PPO | Admitting: Family Medicine

## 2020-03-10 ENCOUNTER — Encounter: Payer: Self-pay | Admitting: Family Medicine

## 2020-03-10 ENCOUNTER — Other Ambulatory Visit: Payer: Self-pay

## 2020-03-10 VITALS — BP 120/70 | HR 57 | Ht 64.5 in | Wt 122.0 lb

## 2020-03-10 DIAGNOSIS — Z0000 Encounter for general adult medical examination without abnormal findings: Secondary | ICD-10-CM | POA: Diagnosis not present

## 2020-03-10 DIAGNOSIS — Z114 Encounter for screening for human immunodeficiency virus [HIV]: Secondary | ICD-10-CM

## 2020-03-10 DIAGNOSIS — S29012A Strain of muscle and tendon of back wall of thorax, initial encounter: Secondary | ICD-10-CM

## 2020-03-10 DIAGNOSIS — Z13220 Encounter for screening for lipoid disorders: Secondary | ICD-10-CM

## 2020-03-10 DIAGNOSIS — M47816 Spondylosis without myelopathy or radiculopathy, lumbar region: Secondary | ICD-10-CM | POA: Insufficient documentation

## 2020-03-10 DIAGNOSIS — Z79899 Other long term (current) drug therapy: Secondary | ICD-10-CM

## 2020-03-10 DIAGNOSIS — I10 Essential (primary) hypertension: Secondary | ICD-10-CM | POA: Diagnosis not present

## 2020-03-10 DIAGNOSIS — M810 Age-related osteoporosis without current pathological fracture: Secondary | ICD-10-CM | POA: Diagnosis not present

## 2020-03-10 DIAGNOSIS — Z1159 Encounter for screening for other viral diseases: Secondary | ICD-10-CM

## 2020-03-10 DIAGNOSIS — Z1329 Encounter for screening for other suspected endocrine disorder: Secondary | ICD-10-CM

## 2020-03-10 DIAGNOSIS — Z1211 Encounter for screening for malignant neoplasm of colon: Secondary | ICD-10-CM

## 2020-03-10 LAB — POCT URINALYSIS DIP (PROADVANTAGE DEVICE)
Bilirubin, UA: NEGATIVE
Blood, UA: NEGATIVE
Glucose, UA: NEGATIVE mg/dL
Ketones, POC UA: NEGATIVE mg/dL
Leukocytes, UA: NEGATIVE
Nitrite, UA: NEGATIVE
Protein Ur, POC: NEGATIVE mg/dL
Specific Gravity, Urine: 1.015
Urobilinogen, Ur: NEGATIVE
pH, UA: 7 (ref 5.0–8.0)

## 2020-03-11 LAB — CBC WITH DIFFERENTIAL/PLATELET
Basophils Absolute: 0.1 10*3/uL (ref 0.0–0.2)
Basos: 2 %
EOS (ABSOLUTE): 0.1 10*3/uL (ref 0.0–0.4)
Eos: 2 %
Hematocrit: 39.8 % (ref 34.0–46.6)
Hemoglobin: 13 g/dL (ref 11.1–15.9)
Immature Grans (Abs): 0 10*3/uL (ref 0.0–0.1)
Immature Granulocytes: 0 %
Lymphocytes Absolute: 1.8 10*3/uL (ref 0.7–3.1)
Lymphs: 44 %
MCH: 29.9 pg (ref 26.6–33.0)
MCHC: 32.7 g/dL (ref 31.5–35.7)
MCV: 92 fL (ref 79–97)
Monocytes Absolute: 0.3 10*3/uL (ref 0.1–0.9)
Monocytes: 8 %
Neutrophils Absolute: 1.9 10*3/uL (ref 1.4–7.0)
Neutrophils: 44 %
Platelets: 226 10*3/uL (ref 150–450)
RBC: 4.35 x10E6/uL (ref 3.77–5.28)
RDW: 13.2 % (ref 11.7–15.4)
WBC: 4.1 10*3/uL (ref 3.4–10.8)

## 2020-03-11 LAB — COMPREHENSIVE METABOLIC PANEL
ALT: 11 IU/L (ref 0–32)
AST: 18 IU/L (ref 0–40)
Albumin/Globulin Ratio: 1.8 (ref 1.2–2.2)
Albumin: 4.7 g/dL (ref 3.8–4.9)
Alkaline Phosphatase: 53 IU/L (ref 48–121)
BUN/Creatinine Ratio: 19 (ref 9–23)
BUN: 17 mg/dL (ref 6–24)
Bilirubin Total: 0.4 mg/dL (ref 0.0–1.2)
CO2: 26 mmol/L (ref 20–29)
Calcium: 10.3 mg/dL — ABNORMAL HIGH (ref 8.7–10.2)
Chloride: 101 mmol/L (ref 96–106)
Creatinine, Ser: 0.89 mg/dL (ref 0.57–1.00)
GFR calc Af Amer: 82 mL/min/{1.73_m2} (ref >59)
GFR calc non Af Amer: 71 mL/min/{1.73_m2} (ref >59)
Globulin, Total: 2.6 g/dL (ref 1.5–4.5)
Glucose: 78 mg/dL (ref 65–99)
Potassium: 4.3 mmol/L (ref 3.5–5.2)
Sodium: 142 mmol/L (ref 134–144)
Total Protein: 7.3 g/dL (ref 6.0–8.5)

## 2020-03-11 LAB — LIPID PANEL
Chol/HDL Ratio: 2.5 ratio (ref 0.0–4.4)
Cholesterol, Total: 184 mg/dL (ref 100–199)
HDL: 75 mg/dL (ref >39)
LDL Chol Calc (NIH): 99 mg/dL (ref 0–99)
Triglycerides: 51 mg/dL (ref 0–149)
VLDL Cholesterol Cal: 10 mg/dL (ref 5–40)

## 2020-03-11 LAB — HIV ANTIBODY (ROUTINE TESTING W REFLEX): HIV Screen 4th Generation wRfx: NONREACTIVE

## 2020-03-11 LAB — T4, FREE: Free T4: 1.22 ng/dL (ref 0.82–1.77)

## 2020-03-11 LAB — TSH: TSH: 0.361 u[IU]/mL — ABNORMAL LOW (ref 0.450–4.500)

## 2020-03-11 LAB — HEPATITIS C ANTIBODY: Hep C Virus Ab: <0.1 s/co ratio (ref 0.0–0.9)

## 2020-03-11 LAB — VITAMIN D 25 HYDROXY (VIT D DEFICIENCY, FRACTURES): Vit D, 25-Hydroxy: 83.7 ng/mL (ref 30.0–100.0)

## 2020-03-26 ENCOUNTER — Other Ambulatory Visit: Payer: Self-pay

## 2020-03-26 ENCOUNTER — Other Ambulatory Visit (INDEPENDENT_AMBULATORY_CARE_PROVIDER_SITE_OTHER): Payer: 59

## 2020-03-26 DIAGNOSIS — Z1211 Encounter for screening for malignant neoplasm of colon: Secondary | ICD-10-CM | POA: Diagnosis not present

## 2020-03-26 LAB — HEMOCCULT GUIAC POC 1CARD (OFFICE)
Card #2 Fecal Occult Blod, POC: NEGATIVE
Card #3 Fecal Occult Blood, POC: NEGATIVE
Fecal Occult Blood, POC: NEGATIVE

## 2020-04-12 ENCOUNTER — Other Ambulatory Visit: Payer: BC Managed Care – PPO

## 2020-04-12 ENCOUNTER — Other Ambulatory Visit: Payer: Self-pay

## 2020-04-12 DIAGNOSIS — Z20822 Contact with and (suspected) exposure to covid-19: Secondary | ICD-10-CM

## 2020-04-15 ENCOUNTER — Encounter: Payer: Self-pay | Admitting: Family Medicine

## 2020-04-15 DIAGNOSIS — I10 Essential (primary) hypertension: Secondary | ICD-10-CM

## 2020-04-15 MED ORDER — LISINOPRIL-HYDROCHLOROTHIAZIDE 20-12.5 MG PO TABS: 1.0000 | ORAL_TABLET | Freq: Every day | ORAL | 5 refills | Status: DC

## 2020-04-15 MED ORDER — AMLODIPINE BESYLATE 5 MG PO TABS: 5.0000 mg | ORAL_TABLET | Freq: Every day | ORAL | 5 refills | Status: DC

## 2020-04-16 LAB — NOVEL CORONAVIRUS, NAA: SARS-CoV-2, NAA: NOT DETECTED

## 2020-04-16 LAB — SPECIMEN STATUS REPORT

## 2020-04-29 ENCOUNTER — Encounter: Payer: Self-pay | Admitting: Family Medicine

## 2020-04-29 ENCOUNTER — Encounter: Payer: Self-pay | Admitting: Internal Medicine

## 2020-05-11 ENCOUNTER — Ambulatory Visit: Payer: BC Managed Care – PPO | Attending: Internal Medicine

## 2020-05-11 DIAGNOSIS — Z23 Encounter for immunization: Secondary | ICD-10-CM

## 2020-05-11 NOTE — Progress Notes (Signed)
   Covid-19 Vaccination Clinic  Name:  Dana Hays    MRN: [id number] DOB: 1961/03/01  05/11/2020  [name] was observed post Covid-19 immunization for 15 minutes without incident. She was provided with Vaccine Information Sheet and instruction to access the V-Safe system.   [name] was instructed to call 911 with any severe reactions post vaccine: Marland Kitchen Difficulty breathing  . Swelling of face and throat  . A fast heartbeat  . A bad rash all over body  . Dizziness and weakness

## 2020-08-17 ENCOUNTER — Encounter: Payer: Self-pay | Admitting: Family Medicine

## 2020-08-17 DIAGNOSIS — I10 Essential (primary) hypertension: Secondary | ICD-10-CM

## 2020-08-17 MED ORDER — LISINOPRIL-HYDROCHLOROTHIAZIDE 20-12.5 MG PO TABS: 1.0000 | ORAL_TABLET | Freq: Every day | ORAL | 2 refills | Status: DC

## 2020-08-17 MED ORDER — AMLODIPINE BESYLATE 5 MG PO TABS: 5.0000 mg | ORAL_TABLET | Freq: Every day | ORAL | 2 refills | Status: DC

## 2020-12-20 ENCOUNTER — Encounter: Payer: Self-pay | Admitting: Family Medicine

## 2020-12-20 DIAGNOSIS — I10 Essential (primary) hypertension: Secondary | ICD-10-CM

## 2020-12-20 MED ORDER — AMLODIPINE BESYLATE 5 MG PO TABS: 5.0000 mg | ORAL_TABLET | Freq: Every day | ORAL | 2 refills | Status: DC

## 2020-12-20 MED ORDER — LISINOPRIL-HYDROCHLOROTHIAZIDE 20-12.5 MG PO TABS: 1.0000 | ORAL_TABLET | Freq: Every day | ORAL | 2 refills | Status: DC

## 2021-01-26 ENCOUNTER — Encounter: Payer: Self-pay | Admitting: Internal Medicine

## 2021-03-15 ENCOUNTER — Encounter: Payer: BC Managed Care – PPO | Admitting: Family Medicine

## 2021-04-05 ENCOUNTER — Telehealth: Payer: Self-pay

## 2021-04-05 NOTE — Telephone Encounter (Signed)
I called pt. Per your request, she stated that she is still taking both of these medication took them both this morning and does not need a refill on them at this time. She is still a patient here and has an apt in October.

## 2021-04-15 ENCOUNTER — Encounter: Payer: Self-pay | Admitting: Family Medicine

## 2021-04-15 ENCOUNTER — Other Ambulatory Visit: Payer: Self-pay

## 2021-04-15 DIAGNOSIS — I10 Essential (primary) hypertension: Secondary | ICD-10-CM

## 2021-04-15 MED ORDER — LISINOPRIL-HYDROCHLOROTHIAZIDE 20-12.5 MG PO TABS: 1.0000 | ORAL_TABLET | Freq: Every day | ORAL | 0 refills | Status: DC

## 2021-04-15 MED ORDER — AMLODIPINE BESYLATE 5 MG PO TABS: 5.0000 mg | ORAL_TABLET | Freq: Every day | ORAL | 0 refills | Status: DC

## 2021-05-05 ENCOUNTER — Other Ambulatory Visit: Payer: Self-pay

## 2021-05-05 ENCOUNTER — Ambulatory Visit (INDEPENDENT_AMBULATORY_CARE_PROVIDER_SITE_OTHER): Payer: BC Managed Care – PPO | Admitting: Family Medicine

## 2021-05-05 ENCOUNTER — Encounter: Payer: Self-pay | Admitting: Family Medicine

## 2021-05-05 VITALS — BP 108/70 | HR 65 | Ht 64.0 in | Wt 123.2 lb

## 2021-05-05 DIAGNOSIS — R202 Paresthesia of skin: Secondary | ICD-10-CM | POA: Diagnosis not present

## 2021-05-05 DIAGNOSIS — Z1329 Encounter for screening for other suspected endocrine disorder: Secondary | ICD-10-CM | POA: Diagnosis not present

## 2021-05-05 DIAGNOSIS — Z13220 Encounter for screening for lipoid disorders: Secondary | ICD-10-CM

## 2021-05-05 DIAGNOSIS — Z0000 Encounter for general adult medical examination without abnormal findings: Secondary | ICD-10-CM

## 2021-05-05 DIAGNOSIS — M47816 Spondylosis without myelopathy or radiculopathy, lumbar region: Secondary | ICD-10-CM

## 2021-05-05 DIAGNOSIS — Z23 Encounter for immunization: Secondary | ICD-10-CM

## 2021-05-05 DIAGNOSIS — I10 Essential (primary) hypertension: Secondary | ICD-10-CM

## 2021-05-05 DIAGNOSIS — M810 Age-related osteoporosis without current pathological fracture: Secondary | ICD-10-CM | POA: Diagnosis not present

## 2021-05-05 DIAGNOSIS — R209 Unspecified disturbances of skin sensation: Secondary | ICD-10-CM

## 2021-05-05 MED ORDER — DICLOFENAC SODIUM 75 MG PO TBEC: 75.0000 mg | DELAYED_RELEASE_TABLET | Freq: Two times a day (BID) | ORAL | 1 refills | Status: DC | PRN

## 2021-05-05 NOTE — Patient Instructions (Addendum)
Call [facility] to schedule your screening colonoscopy.   Try yoga or balance exercises.   Preventive Care 52-61 Years Old, Female Preventive care refers to lifestyle choices and visits with your health care provider that can promote health and wellness. This includes: A yearly physical exam. This is also called an annual wellness visit. Regular dental and eye exams. Immunizations. Screening for certain conditions. Healthy lifestyle choices, such as: Eating a healthy diet. Getting regular exercise. Not using drugs or products that contain nicotine and tobacco. Limiting alcohol use. What can I expect for my preventive care visit? Physical exam Your health care provider will check your: Height and weight. These may be used to calculate your BMI (body mass index). BMI is a measurement that tells if you are at a healthy weight. Heart rate and blood pressure. Body temperature. Skin for abnormal spots. Counseling Your health care provider may ask you questions about your: Past medical problems. Family's medical history. Alcohol, tobacco, and drug use. Emotional well-being. Home life and relationship well-being. Sexual activity. Diet, exercise, and sleep habits. Work and work Statistician. Access to firearms. Method of birth control. Menstrual cycle. Pregnancy history. What immunizations do I need? Vaccines are usually given at various ages, according to a schedule. Your health care provider will recommend vaccines for you based on your age, medical history, and lifestyle or other factors, such as travel or where you work. What tests do I need? Blood tests Lipid and cholesterol levels. These may be checked every 5 years, or more often if you are over 39 years old. Hepatitis C test. Hepatitis B test. Screening Lung cancer screening. You may have this screening every year starting at age 57 if you have a 30-pack-year history of smoking and currently smoke or have quit within the past  15 years. Colorectal cancer screening. All adults should have this screening starting at age 54 and continuing until age 61. Your health care provider may recommend screening at age 31 if you are at increased risk. You will have tests every 1-10 years, depending on your results and the type of screening test. Diabetes screening. This is done by checking your blood sugar (glucose) after you have not eaten for a while (fasting). You may have this done every 1-3 years. Mammogram. This may be done every 1-2 years. Talk with your health care provider about when you should start having regular mammograms. This may depend on whether you have a family history of breast cancer. BRCA-related cancer screening. This may be done if you have a family history of breast, ovarian, tubal, or peritoneal cancers. Pelvic exam and Pap test. This may be done every 3 years starting at age 59. Starting at age 40, this may be done every 5 years if you have a Pap test in combination with an HPV test. Other tests STD (sexually transmitted disease) testing, if you are at risk. Bone density scan. This is done to screen for osteoporosis. You may have this scan if you are at high risk for osteoporosis. Talk with your health care provider about your test results, treatment options, and if necessary, the need for more tests. Follow these instructions at home: Eating and drinking  Eat a diet that includes fresh fruits and vegetables, whole grains, lean protein, and low-fat dairy products. Take vitamin and mineral supplements as recommended by your health care provider. Do not drink alcohol if: Your health care provider tells you not to drink. You are pregnant, may be pregnant, or are planning to  become pregnant. If you drink alcohol: Limit how much you have to 0-1 drink a day. Be aware of how much alcohol is in your drink. In the [place] one drink equals one 12 oz bottle of beer (355 mL), one 5 oz glass of wine (148 mL), or  one 1 oz glass of hard liquor (44 mL). Lifestyle Take daily care of your teeth and gums. Brush your teeth every morning and night with fluoride toothpaste. Floss one time each day. Stay active. Exercise for at least 30 minutes 5 or more days each week. Do not use any products that contain nicotine or tobacco, such as cigarettes, e-cigarettes, and chewing tobacco. If you need help quitting, ask your health care provider. Do not use drugs. If you are sexually active, practice safe sex. Use a condom or other form of protection to prevent STIs (sexually transmitted infections). If you do not wish to become pregnant, use a form of birth control. If you plan to become pregnant, see your health care provider for a prepregnancy visit. If told by your health care provider, take low-dose aspirin daily starting at age 34. Find healthy ways to cope with stress, such as: Meditation, yoga, or listening to music. Journaling. Talking to a trusted person. Spending time with friends and family. Safety Always wear your seat belt while driving or riding in a vehicle. Do not drive: If you have been drinking alcohol. Do not ride with someone who has been drinking. When you are tired or distracted. While texting. Wear a helmet and other protective equipment during sports activities. If you have firearms in your house, make sure you follow all gun safety procedures. What's next? Visit your health care provider once a year for an annual wellness visit. Ask your health care provider how often you should have your eyes and teeth checked. Stay up to date on all vaccines. This information is not intended to replace advice given to you by your health care provider. Make sure you discuss any questions you have with your health care provider. Document Revised: 09/24/2020 Document Reviewed: 03/28/2018 Elsevier Patient Education  2022 [employer].

## 2021-05-05 NOTE — Progress Notes (Signed)
Subjective:    Patient ID: Dana Hays, female    DOB: 09-May-1961, 60 y.o.   MRN: [id number]  HPI Chief Complaint  Patient presents with   Annual Exam    She is here for a complete physical exam.  Other providers: OB/GYN - last saw Dr. Matthew Saras @ [facility] 01/26/21 GI- Dr. Delman Cheadle  Osteoporosis- managed by Dr. Matthew Saras. Taking Boniva.   Numbness/tingling in her feet and legs x 2-3 months. Mainly her lower legs and entire feet and toes.  She also states she has poor balance and has never had issues with her balance. She is exercising less now that in the past.  States her legs are often cold.   Social history: Lives with her husband, works PT as at a middle school. Retired from [employer] Denies smoking, drinking alcohol, drug use  Diet: healthy  Excerise: rides her bike once weekly   Immunizations: Tdap 2016   Health maintenance:   Mammogram: 01/26/2021 Colonoscopy: [facility] in 10/2010 Last Gynecological Exam: 12/2020 Last Dental Exam: last week Last Eye Exam: years ago   Wears seatbelt always, smoke detectors in home and functioning, does not text while driving and feels safe in home environment.   Reviewed allergies, medications, past medical, surgical, family, and social history.   Review of Systems Review of Systems Constitutional: -fever, -chills, -sweats, -unexpected weight change,-fatigue ENT: -runny nose, -ear pain, -sore throat Cardiology:  -chest pain, -palpitations, -edema Respiratory: -cough, -shortness of breath, -wheezing Gastroenterology: -abdominal pain, -nausea, -vomiting, -diarrhea, -constipation  Hematology: -bleeding or bruising problems Musculoskeletal: -arthralgias, -myalgias, -joint swelling, -+back pain Ophthalmology: -vision changes Urology: -dysuria, -difficulty urinating, -hematuria, -urinary frequency, -urgency Neurology: -headache, -weakness, +ingling, +numbness       Objective:   Physical Exam BP 108/70 (BP Location:  Right Arm, Patient Position: Sitting)   Pulse 65   Ht 5\' 4"  (1.626 m)   Wt 123 lb 3.2 oz (55.9 kg)   BMI 21.15 kg/m   General Appearance:    Alert, cooperative, no distress, appears stated age  Head:    Normocephalic, without obvious abnormality, atraumatic  Eyes:    PERRL, conjunctiva/corneas clear, EOM's intact  Ears:    Normal TM's and external ear canals. Small amount of cerumen in canal on right.   Nose:   Mask on  Throat:   Mask on   Neck:   Supple, no lymphadenopathy;  thyroid:  no   enlargement/tenderness/nodules; no JVD  Back:    Spine nontender, no curvature, ROM normal, no CVA     tenderness  Lungs:     Clear to auscultation bilaterally without wheezes, rales or     ronchi; respirations unlabored  Chest Wall:    No tenderness or deformity   Heart:    Regular rate and rhythm, S1 and S2 normal, no murmur, rub   or gallop  Breast Exam:    OB/GYN  Abdomen:     Soft, non-tender, nondistended, normoactive bowel sounds,    no masses, no hepatosplenomegaly  Genitalia:    OB/GYN     Extremities:   No clubbing, cyanosis or edema  Pulses:   2+ and symmetric all extremities  Skin:   Skin color, texture, turgor normal, no rashes or lesions  Lymph nodes:   Cervical, supraclavicular, and axillary nodes normal  Neurologic:   CNII-XII intact, normal strength, sensation and gait           Psych:   Normal mood, affect, hygiene and grooming.  Assessment & Plan:  Routine general medical examination at a health care facility - Plan: CBC with Differential/Platelet, Comprehensive metabolic panel, TSH, T4, free, Lipid panel -preventive healthcare reviewed. OB/GYN visit UTD. Counseling on healthy lifestyle including diet and exercise. Regular dental and eye exams. Work on core exercises and balance exercises. Immunizations reviewed. Discussed safety  Age-related osteoporosis without current pathological fracture - Plan: VITAMIN D 25 Hydroxy (Vit-D Deficiency, Fractures) -managed by  OB/GYN. Taking calcium, vitamin D   Arthritis, lumbar spine - Plan: diclofenac (VOLTAREN) 75 MG EC tablet -take diclofenac prn  Primary hypertension - Plan: CBC with Differential/Platelet, Comprehensive metabolic panel -well controlled. continue medication and low sodium diet.   Screening for thyroid disorder - Plan: TSH, T4, free, T3  Paresthesias - Plan: CBC with Differential/Platelet, Comprehensive metabolic panel, TSH, T4, free, T3, Vitamin B12, Iron, TIBC and Ferritin Panel -normal exam, LE neurovascularly intact. Check labs and follow up. Consider referral to vascular   Sensation of cold in lower extremity - Plan: CBC with Differential/Platelet, Comprehensive metabolic panel, TSH, T4, free, T3, Vitamin B12, Iron, TIBC and Ferritin Panel -normal exam, LE neurovascularly intact   Screening for lipid disorders - Plan: Lipid panel  Need for shingles vaccine - Plan: Varicella-zoster vaccine IM (Shingrix) -counseling on vaccine and side effects

## 2021-05-06 LAB — IRON,TIBC AND FERRITIN PANEL
Ferritin: 58 ng/mL (ref 15–150)
Iron Saturation: 27 % (ref 15–55)
Iron: 87 ug/dL (ref 27–159)
Total Iron Binding Capacity: 326 ug/dL (ref 250–450)
UIBC: 239 ug/dL (ref 131–425)

## 2021-05-06 LAB — CBC WITH DIFFERENTIAL/PLATELET
Basophils Absolute: 0.1 10*3/uL (ref 0.0–0.2)
Basos: 1 %
EOS (ABSOLUTE): 0.1 10*3/uL (ref 0.0–0.4)
Eos: 2 %
Hematocrit: 39.1 % (ref 34.0–46.6)
Hemoglobin: 13.1 g/dL (ref 11.1–15.9)
Immature Grans (Abs): 0 10*3/uL (ref 0.0–0.1)
Immature Granulocytes: 0 %
Lymphocytes Absolute: 1.8 10*3/uL (ref 0.7–3.1)
Lymphs: 40 %
MCH: 30 pg (ref 26.6–33.0)
MCHC: 33.5 g/dL (ref 31.5–35.7)
MCV: 90 fL (ref 79–97)
Monocytes Absolute: 0.3 10*3/uL (ref 0.1–0.9)
Monocytes: 6 %
Neutrophils Absolute: 2.4 10*3/uL (ref 1.4–7.0)
Neutrophils: 51 %
Platelets: 239 10*3/uL (ref 150–450)
RBC: 4.37 x10E6/uL (ref 3.77–5.28)
RDW: 13.5 % (ref 11.7–15.4)
WBC: 4.6 10*3/uL (ref 3.4–10.8)

## 2021-05-06 LAB — COMPREHENSIVE METABOLIC PANEL
ALT: 11 IU/L (ref 0–32)
AST: 18 IU/L (ref 0–40)
Albumin/Globulin Ratio: 2 (ref 1.2–2.2)
Albumin: 4.9 g/dL (ref 3.8–4.9)
Alkaline Phosphatase: 64 IU/L (ref 44–121)
BUN/Creatinine Ratio: 15 (ref 12–28)
BUN: 12 mg/dL (ref 8–27)
Bilirubin Total: 0.4 mg/dL (ref 0.0–1.2)
CO2: 26 mmol/L (ref 20–29)
Calcium: 10.4 mg/dL — ABNORMAL HIGH (ref 8.7–10.3)
Chloride: 99 mmol/L (ref 96–106)
Creatinine, Ser: 0.81 mg/dL (ref 0.57–1.00)
Globulin, Total: 2.5 g/dL (ref 1.5–4.5)
Glucose: 80 mg/dL (ref 70–99)
Potassium: 4.4 mmol/L (ref 3.5–5.2)
Sodium: 138 mmol/L (ref 134–144)
Total Protein: 7.4 g/dL (ref 6.0–8.5)
eGFR: 83 mL/min/{1.73_m2} (ref >59)

## 2021-05-06 LAB — TSH: TSH: 0.36 u[IU]/mL — ABNORMAL LOW (ref 0.450–4.500)

## 2021-05-06 LAB — LIPID PANEL
Chol/HDL Ratio: 2.3 ratio (ref 0.0–4.4)
Cholesterol, Total: 193 mg/dL (ref 100–199)
HDL: 85 mg/dL (ref >39)
LDL Chol Calc (NIH): 98 mg/dL (ref 0–99)
Triglycerides: 52 mg/dL (ref 0–149)
VLDL Cholesterol Cal: 10 mg/dL (ref 5–40)

## 2021-05-06 LAB — T3: T3, Total: 124 ng/dL (ref 71–180)

## 2021-05-06 LAB — VITAMIN B12: Vitamin B-12: 1832 pg/mL — ABNORMAL HIGH (ref 232–1245)

## 2021-05-06 LAB — VITAMIN D 25 HYDROXY (VIT D DEFICIENCY, FRACTURES): Vit D, 25-Hydroxy: 95.8 ng/mL (ref 30.0–100.0)

## 2021-05-06 LAB — T4, FREE: Free T4: 1.14 ng/dL (ref 0.82–1.77)

## 2021-05-09 ENCOUNTER — Encounter: Payer: Self-pay | Admitting: Family Medicine

## 2021-05-11 ENCOUNTER — Encounter: Payer: Self-pay | Admitting: Family Medicine

## 2021-05-11 ENCOUNTER — Telehealth: Payer: Self-pay

## 2021-05-11 DIAGNOSIS — R209 Unspecified disturbances of skin sensation: Secondary | ICD-10-CM

## 2021-05-11 DIAGNOSIS — R202 Paresthesia of skin: Secondary | ICD-10-CM

## 2021-05-11 NOTE — Telephone Encounter (Signed)
Called patient and notified of referral

## 2021-05-12 ENCOUNTER — Other Ambulatory Visit: Payer: Self-pay | Admitting: Family Medicine

## 2021-05-12 DIAGNOSIS — I10 Essential (primary) hypertension: Secondary | ICD-10-CM

## 2021-05-12 MED ORDER — LISINOPRIL-HYDROCHLOROTHIAZIDE 20-12.5 MG PO TABS
1.0000 | ORAL_TABLET | Freq: Every day | ORAL | 3 refills | Status: DC
Start: 2021-05-12 — End: 2021-10-10

## 2021-05-12 MED ORDER — AMLODIPINE BESYLATE 5 MG PO TABS: 5.0000 mg | ORAL_TABLET | Freq: Every day | ORAL | 3 refills | Status: DC

## 2021-05-13 ENCOUNTER — Encounter: Payer: Self-pay | Admitting: Neurology

## 2021-05-24 ENCOUNTER — Encounter: Payer: BC Managed Care – PPO | Admitting: Family Medicine

## 2021-06-10 ENCOUNTER — Encounter: Payer: Self-pay | Admitting: Neurology

## 2021-06-10 ENCOUNTER — Ambulatory Visit (INDEPENDENT_AMBULATORY_CARE_PROVIDER_SITE_OTHER): Payer: BC Managed Care – PPO | Admitting: Neurology

## 2021-06-10 ENCOUNTER — Other Ambulatory Visit: Payer: Self-pay

## 2021-06-10 VITALS — BP 172/91 | HR 68 | Ht 64.0 in | Wt 124.0 lb

## 2021-06-10 DIAGNOSIS — G959 Disease of spinal cord, unspecified: Secondary | ICD-10-CM | POA: Diagnosis not present

## 2021-06-10 DIAGNOSIS — M4807 Spinal stenosis, lumbosacral region: Secondary | ICD-10-CM | POA: Diagnosis not present

## 2021-06-10 NOTE — Patient Instructions (Addendum)
MRI lumbar spine wo contrast   We will call you with the results and let you know the results

## 2021-06-10 NOTE — Progress Notes (Signed)
[facility] Clinic Note - Initial Visit   Date: 06/10/21  REGENE MCCARTHY MRN: [id number] DOB: 14-Feb-1961   Dear Harland Dingwall, PA-C:  Thank you for your kind referral of Micalah Cabezas Dizon for consultation of bilateral leg paresthesias. Although her history is well known to you, please allow [place] to reiterate it for the purpose of our medical record. The patient was accompanied to the clinic by self.    History of Present Illness: Dana Hays is a 60 y.o. left-handed female with hypertension and osteoporosis presenting for evaluation of bilateral leg numbness/tingling.   Starting around spring of 2022, she began having tingling/numbness in the toes which has gradually moved up her legs, to her knees.  She also has noticed that her legs feel heavy and has cold sensation involving the legs. She is very active and exercises regularly (running, walking, cycling), but it's taking more effort and legs do not feel the same. Symptoms are constant and worse at night time.  She occasionally has low back pain. No radicular leg pain.  No cramps.    No history of diabetes, alcohol, or chemotherapy exposure.    She works part-time in a middle school, she retired from [employer].  She lives at home with her husband in a two-level home.   Out-side paper records, electronic medical record, and images have been reviewed where available and summarized as:  No results found for: HGBA1C Lab Results  Component Value Date   VITAMINB12 1,832 (H) 05/05/2021   Lab Results  Component Value Date   TSH 0.360 (L) 05/05/2021   No results found for: ESRSEDRATE, POCTSEDRATE  Past Medical History:  Diagnosis Date   Hypertension    Osteoporosis 03/17/2019    Past Surgical History:  Procedure Laterality Date   APPENDECTOMY     MYOMECTOMY       Medications:  Outpatient Encounter Medications as of 06/10/2021  Medication Sig   amLODipine (NORVASC) 5 MG tablet Take 1  tablet (5 mg total) by mouth daily.   diclofenac (VOLTAREN) 75 MG EC tablet Take 1 tablet (75 mg total) by mouth 2 (two) times daily as needed.   ibandronate (BONIVA) 150 MG tablet Take 150 mg by mouth every 30 (thirty) days. Take in the morning with a full glass of water, on an empty stomach, and do not take anything else by mouth or lie down for the next 30 min.   lisinopril-hydrochlorothiazide (ZESTORETIC) 20-12.5 MG tablet Take 1 tablet by mouth daily.   No facility-administered encounter medications on file as of 06/10/2021.    Allergies: No Known Allergies  Family History: Family History  Problem Relation Age of Onset   Hypertension Mother    Hypertension Father    Arthritis Father     Social History: Social History   Tobacco Use   Smoking status: Never   Smokeless tobacco: Never  Substance Use Topics   Alcohol use: Never   Drug use: Never   Social History   Social History Narrative   Has 2 step children    Left handed    Lives in a two story home     Vital Signs:  BP (!) 172/91   Pulse 68   Ht 5\' 4"  (1.626 m)   Wt 124 lb (56.2 kg)   SpO2 100%   BMI 21.28 kg/m   Neurological Exam: MENTAL STATUS including orientation to time, place, person, recent and remote memory, attention span and concentration, language, and fund of  knowledge is normal.  Speech is not dysarthric.  CRANIAL NERVES: II:  No visual field defects.     III-IV-VI: Pupils equal round and reactive to light.  Normal conjugate, extra-ocular eye movements in all directions of gaze.  No nystagmus.  No ptosis.   V:  Normal facial sensation.    VII:  Normal facial symmetry and movements.   VIII:  Normal hearing and vestibular function.   IX-X:  Normal palatal movement.   XI:  Normal shoulder shrug and head rotation.   XII:  Normal tongue strength and range of motion, no deviation or fasciculation.  MOTOR:  No atrophy, fasciculations or abnormal movements.  No pronator drift.   Upper Extremity:   Right  Left  Deltoid  5/5   5/5   Biceps  5/5   5/5   Triceps  5/5   5/5   Infraspinatus 5/5  5/5  Medial pectoralis 5/5  5/5  Wrist extensors  5/5   5/5   Wrist flexors  5/5   5/5   Finger extensors  5/5   5/5   Finger flexors  5/5   5/5   Dorsal interossei  5/5   5/5   Abductor pollicis  5/5   5/5   Tone (Ashworth scale)  0  0   Lower Extremity:  Right  Left  Hip flexors  5/5   5/5   Hip extensors  5/5   5/5   Adductor 5/5  5/5  Abductor 5/5  5/5  Knee flexors  5/5   5/5   Knee extensors  5/5   5/5   Dorsiflexors  5/5   5/5   Plantarflexors  5/5   5/5   Toe extensors  5/5   5/5   Toe flexors  5/5   5/5   Tone (Ashworth scale)  0  0+   MSRs:  Right        Left                  brachioradialis 2+  2+  biceps 2+  2+  triceps 2+  2+  patellar 3+  3+  ankle jerk 3+  3+  Hoffman no  no  plantar response down  down  1-2 beat ankle clonus bilaterally  SENSORY:  Vibration reduced at the ankles bilaterally, worse on the left.  Pin prick and temperature intact.   Romberg's sign positive.   COORDINATION/GAIT: Normal finger-to- nose-finger.  Intact rapid alternating movements bilaterally.  Gait narrow based and stable. Tandem and stressed gait intact.    IMPRESSION: Subacute and progressive bilateral leg paresthesia. Exam shows brisk lower extremity reflexes, ankle clonus, and increased tone involving the left leg suggestive of myelopathy. Exam does not support peripheral neuropathy as reflexes would be diminished  - MRI lumbar spine wo contrast will be ordered to evaluate for compressive pathology  Further recommendations pending results.   Thank you for allowing me to participate in patient's care.  If I can answer any additional questions, I would be pleased to do so.    Sincerely,    Babacar Haycraft K. Posey Pronto, DO

## 2021-06-17 ENCOUNTER — Ambulatory Visit
Admission: RE | Admit: 2021-06-17 | Discharge: 2021-06-17 | Disposition: A | Discharge status: Home / Self Care | Payer: BC Managed Care – PPO | Source: Ambulatory Visit | Attending: Neurology | Admitting: Neurology

## 2021-06-17 ENCOUNTER — Other Ambulatory Visit: Payer: Self-pay

## 2021-06-17 DIAGNOSIS — M4807 Spinal stenosis, lumbosacral region: Secondary | ICD-10-CM

## 2021-06-17 DIAGNOSIS — G959 Disease of spinal cord, unspecified: Secondary | ICD-10-CM

## 2021-06-20 ENCOUNTER — Other Ambulatory Visit: Payer: BC Managed Care – PPO

## 2021-06-22 ENCOUNTER — Other Ambulatory Visit: Payer: BC Managed Care – PPO

## 2021-07-05 ENCOUNTER — Other Ambulatory Visit: Payer: BC Managed Care – PPO

## 2021-07-12 ENCOUNTER — Ambulatory Visit
Admission: RE | Admit: 2021-07-12 | Discharge: 2021-07-12 | Disposition: A | Discharge status: Home / Self Care | Payer: BC Managed Care – PPO | Source: Ambulatory Visit | Attending: Neurology | Admitting: Neurology

## 2021-07-12 ENCOUNTER — Other Ambulatory Visit: Payer: Self-pay

## 2021-07-15 ENCOUNTER — Ambulatory Visit: Payer: 59 | Admitting: Neurology

## 2021-07-18 ENCOUNTER — Other Ambulatory Visit: Payer: Self-pay

## 2021-07-18 DIAGNOSIS — G959 Disease of spinal cord, unspecified: Secondary | ICD-10-CM

## 2021-08-12 ENCOUNTER — Emergency Department (HOSPITAL_COMMUNITY)
Admission: EM | Admit: 2021-08-12 | Discharge: 2021-08-12 | Disposition: A | Discharge status: Home / Self Care | Payer: BC Managed Care – PPO | Attending: Emergency Medicine | Admitting: Emergency Medicine

## 2021-08-12 ENCOUNTER — Other Ambulatory Visit: Payer: Self-pay

## 2021-08-12 ENCOUNTER — Ambulatory Visit
Admission: RE | Admit: 2021-08-12 | Discharge: 2021-08-12 | Disposition: A | Discharge status: Home / Self Care | Payer: BC Managed Care – PPO | Source: Ambulatory Visit | Attending: Neurology | Admitting: Neurology

## 2021-08-12 DIAGNOSIS — D492 Neoplasm of unspecified behavior of bone, soft tissue, and skin: Secondary | ICD-10-CM | POA: Diagnosis not present

## 2021-08-12 DIAGNOSIS — G959 Disease of spinal cord, unspecified: Secondary | ICD-10-CM

## 2021-08-12 DIAGNOSIS — Z20822 Contact with and (suspected) exposure to covid-19: Secondary | ICD-10-CM | POA: Insufficient documentation

## 2021-08-12 DIAGNOSIS — G9520 Unspecified cord compression: Secondary | ICD-10-CM | POA: Diagnosis not present

## 2021-08-12 DIAGNOSIS — R531 Weakness: Secondary | ICD-10-CM | POA: Diagnosis present

## 2021-08-12 LAB — CBC WITH DIFFERENTIAL/PLATELET
Abs Immature Granulocytes: 0.01 10*3/uL (ref 0.00–0.07)
Basophils Absolute: 0.1 10*3/uL (ref 0.0–0.1)
Basophils Relative: 1 %
Eosinophils Absolute: 0.1 10*3/uL (ref 0.0–0.5)
Eosinophils Relative: 2 %
HCT: 37.8 % (ref 36.0–46.0)
Hemoglobin: 12.6 g/dL (ref 12.0–15.0)
Immature Granulocytes: 0 %
Lymphocytes Relative: 44 %
Lymphs Abs: 2.5 10*3/uL (ref 0.7–4.0)
MCH: 30.4 pg (ref 26.0–34.0)
MCHC: 33.3 g/dL (ref 30.0–36.0)
MCV: 91.3 fL (ref 80.0–100.0)
Monocytes Absolute: 0.5 10*3/uL (ref 0.1–1.0)
Monocytes Relative: 8 %
Neutro Abs: 2.6 10*3/uL (ref 1.7–7.7)
Neutrophils Relative %: 45 %
Platelets: 275 10*3/uL (ref 150–400)
RBC: 4.14 MIL/uL (ref 3.87–5.11)
RDW: 12.2 % (ref 11.5–15.5)
WBC: 5.7 10*3/uL (ref 4.0–10.5)
nRBC: 0 % (ref 0.0–0.2)

## 2021-08-12 LAB — BASIC METABOLIC PANEL
Anion gap: 5 (ref 5–15)
BUN: 13 mg/dL (ref 8–23)
CO2: 27 mmol/L (ref 22–32)
Calcium: 9.1 mg/dL (ref 8.9–10.3)
Chloride: 108 mmol/L (ref 98–111)
Creatinine, Ser: 0.72 mg/dL (ref 0.44–1.00)
GFR, Estimated: >60 mL/min (ref >60)
Glucose, Bld: 93 mg/dL (ref 70–99)
Potassium: 4.3 mmol/L (ref 3.5–5.1)
Sodium: 140 mmol/L (ref 135–145)

## 2021-08-12 LAB — RESP PANEL BY RT-PCR (FLU A&B, COVID) ARPGX2
Influenza A by PCR: NEGATIVE
Influenza B by PCR: NEGATIVE
SARS Coronavirus 2 by RT PCR: NEGATIVE

## 2021-08-12 NOTE — ED Provider Triage Note (Signed)
Emergency Medicine Provider Triage Evaluation Note  Dana Hays , a 61 y.o. female  was evaluated in triage.  Pt complains of bilateral leg tingling paresthesias and heaviness states that is been ongoing for several months has been progressive she seen by neurology who ordered a MRI of her C and T-spine.  Abnormal findings were discovered and she was told to come to the emergency room.  No saddle anesthesia no bowel or bladder incontinence  Review of Systems  Positive: Back pain Negative: Fever  Physical Exam  BP (!) 173/102    Pulse 93    Temp 98 F (36.7 C) (Oral)    Resp 18    SpO2 99%  Gen:   Awake, no distress   Resp:  Normal effort  MSK:   Moves extremities without difficulty  Other:  Brisk bilateral patellar reflexes  Medical Decision Making  Medically screening exam initiated at 7:26 PM.  Appropriate orders placed.  Dana Hays was informed that the remainder of the evaluation will be completed by another provider, this initial triage assessment does not replace that evaluation, and the importance of remaining in the ED until their evaluation is complete.  Consult placed to Dr. Kathyrn Sheriff basic labs and COVID influenza swab obtained   Tedd Sias, [place] 08/12/21 [id number]

## 2021-08-12 NOTE — ED Provider Notes (Signed)
I received call from radiology - pt is currently in waiting room had MRI done at outside imaging center.   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   Cord is completely deformed evidence of compression present.    Dana Hays, [place] 08/12/21 Lurline Hare    Fredia Sorrow, MD 08/18/21 1030

## 2021-08-12 NOTE — ED Triage Notes (Signed)
Pt here rom home for abnormal MRI finding. Pt has had bilateral leg numbness and heaviness for "months". Pt saw a neurologist and had an MRI of C and T spine done, pt was told to come to ER.

## 2021-08-12 NOTE — ED Provider Notes (Signed)
[facility] EMERGENCY DEPARTMENT Provider Note   CSN: [id number] Arrival date & time: 08/12/21  1835     History  No chief complaint on file.   Dana Hays is a 61 y.o. female.  HPI  Patient is a 61 year old female presented emergency room today with complaint of bilateral leg weakness she states that she had an MRI done earlier today and was told to come to the emergency room as a result of what was found.  She states that for the past several months she has had progressively worsening lower extremity symmetric paresthesias numbness tingling sometimes her legs feel heavy and cold.  She is very active and runs frequently but states that she was recently ran a 5K and felt that her legs were very heavy  No bowel or bladder incontinence.  No saddle anesthesia.  No cancer history.     Home Medications Prior to Admission medications   Medication Sig Start Date End Date Taking? Authorizing Provider  amLODipine (NORVASC) 5 MG tablet Take 1 tablet (5 mg total) by mouth daily. 05/12/21   Henson, Vickie L, PA-C  diclofenac (VOLTAREN) 75 MG EC tablet Take 1 tablet (75 mg total) by mouth 2 (two) times daily as needed. 05/05/21   Henson, Vickie L, PA-C  ibandronate (BONIVA) 150 MG tablet Take 150 mg by mouth every 30 (thirty) days. Take in the morning with a full glass of water, on an empty stomach, and do not take anything else by mouth or lie down for the next 30 min.    [provider]  lisinopril-hydrochlorothiazide (ZESTORETIC) 20-12.5 MG tablet Take 1 tablet by mouth daily. 05/12/21   Henson, Vickie L, PA-C      Allergies    Patient has no known allergies.    Review of Systems   Review of Systems  Constitutional:  Negative for chills and fever.  HENT:  Negative for congestion.   Eyes:  Negative for pain.  Respiratory:  Negative for cough and shortness of breath.   Cardiovascular:  Negative for chest pain and leg swelling.  Gastrointestinal:  Negative  for abdominal pain and vomiting.  Genitourinary:  Negative for dysuria.  Musculoskeletal:  Negative for myalgias.       Upper back pain  Skin:  Negative for rash.  Neurological:  Negative for dizziness and headaches.       Bilateral lower extremity weakness   Physical Exam Updated Vital Signs BP (!) 173/102    Pulse 93    Temp 98 F (36.7 C) (Oral)    Resp 18    SpO2 99%  Physical Exam Vitals and nursing note reviewed.  Constitutional:      General: She is not in acute distress. HENT:     Head: Normocephalic and atraumatic.     Nose: Nose normal.  Eyes:     General: No scleral icterus. Cardiovascular:     Rate and Rhythm: Normal rate and regular rhythm.     Pulses: Normal pulses.     Heart sounds: Normal heart sounds.  Pulmonary:     Effort: Pulmonary effort is normal. No respiratory distress.     Breath sounds: Normal breath sounds. No wheezing.  Abdominal:     Palpations: Abdomen is soft.     Tenderness: There is no abdominal tenderness. There is no guarding or rebound.  Musculoskeletal:     Cervical back: Normal range of motion.     Right lower leg: No edema.  Left lower leg: No edema.  Skin:    General: Skin is warm and dry.     Capillary Refill: Capillary refill takes less than 2 seconds.  Neurological:     Mental Status: She is alert. Mental status is at baseline.     Comments: Brisk patellar reflexes bilaterally.  Sensation intact in bilateral lower extremity.  Ambulates without difficulty.  Psychiatric:        Mood and Affect: Mood normal.        Behavior: Behavior normal.    ED Results / Procedures / Treatments   Labs (all labs ordered are listed, but only abnormal results are displayed) Labs Reviewed  RESP PANEL BY RT-PCR (FLU A&B, COVID) ARPGX2  BASIC METABOLIC PANEL  CBC WITH DIFFERENTIAL/PLATELET    EKG None  Radiology MR CERVICAL SPINE WO CONTRAST  Result Date: 08/12/2021 CLINICAL DATA:  Numbness, coldness, and weakness in bilateral legs,  numbness in bilateral feet EXAM: MRI CERVICAL AND THORACIC SPINE WITHOUT CONTRAST TECHNIQUE: Multiplanar and multiecho pulse sequences of the cervical spine, to include the craniocervical junction and cervicothoracic junction, and the thoracic spine, were obtained without intravenous contrast. COMPARISON:  None. FINDINGS: MRI CERVICAL SPINE FINDINGS Alignment: Straightening and mild reversal of the normal cervical lordosis. Trace anterolisthesis of C3 on C4. Vertebrae: No fracture, evidence of discitis, or bone lesion. Cord: Normal signal and morphology. Posterior Fossa, vertebral arteries, paraspinal tissues: Negative. Disc levels: C2-C3: Mild disc bulge. Mild facet arthropathy. No spinal canal stenosis or neural foraminal narrowing. C3-C4: Moderate disc bulge. Left-greater-than-right facet and uncovertebral hypertrophy, with subchondral cystic changes on the left. No spinal canal stenosis. No neural foraminal narrowing. C4-C5: Mild disc bulge, which indents the ventral thecal sac and spinal cord. No spinal canal stenosis. Facet arthropathy. No neural foraminal narrowing. C5-C6: Mild disc bulge. Facet arthropathy. No spinal canal stenosis or neural foraminal narrowing. Mild disc bulge. No spinal canal stenosis or neural foraminal narrowing. C6-C7: No significant disc bulge. No spinal canal stenosis or neuroforaminal narrowing. C7-T1: No significant disc bulge. No spinal canal stenosis or neuroforaminal narrowing. MRI THORACIC SPINE FINDINGS Alignment:  Physiologic. Vertebrae: No fracture, evidence of discitis, or suspicious bone lesion. Cord: Intradural extramedullary mass along the posterior aspect of the thecal sac at T2-T3, measuring 7 x 9 x 21 mm (AP x TR x CC) (series 20, image 9 and series 19, image 9), which causes significant flattening and compression of the spinal cord at this level. The mass is T2 hypointense and does not appear to originate from a nerve sheath. No definite cord signal abnormality,  although there may be a small amount of increased T2 signal within the spinal cord at the disc space (series 19, image 10), although this is difficult to corroborate on axial sequences. Paraspinal and other soft tissues: Negative. Disc levels: No significant osseous spinal canal stenosis or neural foraminal narrowing. IMPRESSION: 1. Intradural extramedullary mass along posterior aspect of the thecal sac at T2-T3, favored to represent a meningioma, which causes significant flattening and compression of the spinal cord at this level, without definite cord signal abnormality. 2. Mild degenerative changes in the cervical spine, without significant spinal canal stenosis or neural foraminal narrowing. These results were called by telephone at the time of interpretation on 08/12/2021 at 7:02pm to provider [facility], who verbally acknowledged these results. Electronically Signed   By: Merilyn Baba M.D.   On: 08/12/2021 19:02   MR THORACIC SPINE WO CONTRAST  Result Date: 08/12/2021 CLINICAL DATA:  Numbness, coldness, and  weakness in bilateral legs, numbness in bilateral feet EXAM: MRI CERVICAL AND THORACIC SPINE WITHOUT CONTRAST TECHNIQUE: Multiplanar and multiecho pulse sequences of the cervical spine, to include the craniocervical junction and cervicothoracic junction, and the thoracic spine, were obtained without intravenous contrast. COMPARISON:  None. FINDINGS: MRI CERVICAL SPINE FINDINGS Alignment: Straightening and mild reversal of the normal cervical lordosis. Trace anterolisthesis of C3 on C4. Vertebrae: No fracture, evidence of discitis, or bone lesion. Cord: Normal signal and morphology. Posterior Fossa, vertebral arteries, paraspinal tissues: Negative. Disc levels: C2-C3: Mild disc bulge. Mild facet arthropathy. No spinal canal stenosis or neural foraminal narrowing. C3-C4: Moderate disc bulge. Left-greater-than-right facet and uncovertebral hypertrophy, with subchondral cystic changes on the left. No  spinal canal stenosis. No neural foraminal narrowing. C4-C5: Mild disc bulge, which indents the ventral thecal sac and spinal cord. No spinal canal stenosis. Facet arthropathy. No neural foraminal narrowing. C5-C6: Mild disc bulge. Facet arthropathy. No spinal canal stenosis or neural foraminal narrowing. Mild disc bulge. No spinal canal stenosis or neural foraminal narrowing. C6-C7: No significant disc bulge. No spinal canal stenosis or neuroforaminal narrowing. C7-T1: No significant disc bulge. No spinal canal stenosis or neuroforaminal narrowing. MRI THORACIC SPINE FINDINGS Alignment:  Physiologic. Vertebrae: No fracture, evidence of discitis, or suspicious bone lesion. Cord: Intradural extramedullary mass along the posterior aspect of the thecal sac at T2-T3, measuring 7 x 9 x 21 mm (AP x TR x CC) (series 20, image 9 and series 19, image 9), which causes significant flattening and compression of the spinal cord at this level. The mass is T2 hypointense and does not appear to originate from a nerve sheath. No definite cord signal abnormality, although there may be a small amount of increased T2 signal within the spinal cord at the disc space (series 19, image 10), although this is difficult to corroborate on axial sequences. Paraspinal and other soft tissues: Negative. Disc levels: No significant osseous spinal canal stenosis or neural foraminal narrowing. IMPRESSION: 1. Intradural extramedullary mass along posterior aspect of the thecal sac at T2-T3, favored to represent a meningioma, which causes significant flattening and compression of the spinal cord at this level, without definite cord signal abnormality. 2. Mild degenerative changes in the cervical spine, without significant spinal canal stenosis or neural foraminal narrowing. These results were called by telephone at the time of interpretation on 08/12/2021 at 7:02pm to provider [facility], who verbally acknowledged these results. Electronically  Signed   By: Merilyn Baba M.D.   On: 08/12/2021 19:02    Procedures Procedures    Medications Ordered in ED Medications - No data to display  ED Course/ Medical Decision Making/ A&P Clinical Course as of 08/12/21 2103  Fri Aug 12, 2021  2025 Discussed w Dr. Ralene Ok.  Does not require admission. Will see in office Monday. [WF]    Clinical Course User Index [WF] Tedd Sias, [place]                           Medical Decision Making  This patient presents to the ED for concern of back pain and lower extremity weakness, this involves a number of treatment options, and is a complaint that carries with it a high risk of complications and morbidity.  The differential diagnosis includes a lengthy list of options however given the MRI was obtained outpatient diagnosis has been narrowed to space-occupying lesion found in the T-spine   Co morbidities: Discussed in HPI   Additional  history: Obtained from prior neurology notes  External records from outside source obtained and reviewed including prior neurology notes   Lab Tests:  I ordered, and personally interpreted labs.  The pertinent results include:    I personally reviewed all laboratory work and imaging. Metabolic panel without any acute abnormality specifically kidney function within normal limits and no significant electrolyte abnormalities. CBC without leukocytosis or significant anemia.    Imaging Studies:   I independently visualized and interpreted imaging which showed thoracic mass consistent with meningioma I agree with the radiologist interpretation    Medicines ordered:  No medications ordered or indicated.  Critical Interventions:  Establish follow-up with neurosurgery   Consults:  I requested consultation with the Dr. Kathyrn Sheriff,  and discussed lab and imaging findings as well as pertinent plan - they recommend: Follow-up outpatient on Monday in office    Reevaluation:  After the  interventions noted above, I reevaluated the patient and found that they have :stayed the same   Social Determinants of Health:  N/A  Dispostion:  After consideration of the diagnostic results and the patients response to treatment, I feel that the patent would benefit from discharge home with close follow-up with neurosurgery     Final Clinical Impression(s) / ED Diagnoses Final diagnoses:  Spinal cord compression [facility])  Thoracic spine tumor    Rx / DC Orders ED Discharge Orders     None         Tedd Sias, [place] 34/91/79 1505    Campbell Stall P, DO 69/79/48 1533

## 2021-08-12 NOTE — Discharge Instructions (Signed)
I discussed her case with Dr. Kathyrn Sheriff He is 1 of our neurosurgeons.  I also discussed your symptoms and your physical exam.  He would like to see you in the office Monday.  I have given you the information for his office and recommend calling however he has assured me that they will be able to schedule you to be evaluated sometime during the day Monday.  I have printed you a work note for Monday in advance.

## 2021-08-12 NOTE — ED Notes (Signed)
RN reviewed discharge instructions w/ pt. Follow up reviewed, pt had no further questions 

## 2021-08-12 NOTE — Progress Notes (Signed)
Pt at [facility] imaging for Cervical and Thoracic spine. Images of T-spine showed an abnormality which was then reviewed by neuro radiologist Dr. Anthony Sar. He suggested sending pt immediately to an ED for evaluation by neurology team. [phone] pt instructed and given directions to go to [facility] ER. Pt acknowledged and agrees to go to ER. Pt is not in any focal deficit and alert and oriented x 4.

## 2021-08-15 ENCOUNTER — Telehealth: Payer: Self-pay | Admitting: Neurology

## 2021-08-15 NOTE — Telephone Encounter (Signed)
Called patient with results of her MRI thoracic spine which shows compressive extramedullary, intradural mass at T2-3,suggestive of meningoma causing severe flattening of the spinal cord. She is aware of these findings and scheduled to see Dr. Kathyrn Sheriff for evaluation today.

## 2021-08-18 ENCOUNTER — Other Ambulatory Visit (HOSPITAL_COMMUNITY): Payer: Self-pay | Admitting: Neurosurgery

## 2021-08-18 ENCOUNTER — Other Ambulatory Visit: Payer: Self-pay | Admitting: Neurosurgery

## 2021-08-18 DIAGNOSIS — D334 Benign neoplasm of spinal cord: Secondary | ICD-10-CM

## 2021-08-22 ENCOUNTER — Other Ambulatory Visit: Payer: Self-pay | Admitting: Neurosurgery

## 2021-09-15 NOTE — Pre-Procedure Instructions (Signed)
Surgical Instructions    Your procedure is scheduled on Tuesday, February 21st.  Report to [facility] Main Entrance "A" at 5:30 A.M., then check in with the Admitting office.  Call this number if you have problems the morning of surgery:  [phone]   If you have any questions prior to your surgery date call [phone]: Open Monday-Friday 8am-4pm    Remember:  Do not eat or drink after midnight the night before your surgery   Take these medicines the morning of surgery with A SIP OF WATER  amLODipine (NORVASC) Brimonidine Tartrate (LUMIFY)-as needed for dry eyes  As of today, STOP taking any Aspirin (unless otherwise instructed by your surgeon) Aleve, Naproxen, Ibuprofen, Motrin, Advil, Goody's, BC's, all herbal medications, fish oil, and all vitamins.                     Do NOT Smoke (Tobacco/Vaping) for 24 hours prior to your procedure.  If you use a CPAP at night, you may bring your mask/headgear for your overnight stay.   Contacts, glasses, piercing's, hearing aid's, dentures or partials may not be worn into surgery, please bring cases for these belongings.    For patients admitted to the hospital, discharge time will be determined by your treatment team.   Patients discharged the day of surgery will not be allowed to drive home, and someone needs to stay with them for 24 hours.  NO VISITORS WILL BE ALLOWED IN PRE-OP WHERE PATIENTS ARE PREPPED FOR SURGERY.  ONLY 1 SUPPORT PERSON MAY BE PRESENT IN THE WAITING ROOM WHILE YOU ARE IN SURGERY.  IF YOU ARE TO BE ADMITTED, ONCE YOU ARE IN YOUR ROOM YOU WILL BE ALLOWED TWO (2) VISITORS. (1) VISITOR MAY STAY OVERNIGHT BUT MUST ARRIVE TO THE ROOM BY 8pm.  Minor children may have two parents present. Special consideration for safety and communication needs will be reviewed on a case by case basis.   Special instructions:   [facility]- Preparing For Surgery  Before surgery, you can play an important role. Because skin is not  sterile, your skin needs to be as free of germs as possible. You can reduce the number of germs on your skin by washing with CHG (chlorahexidine gluconate) Soap before surgery.  CHG is an antiseptic cleaner which kills germs and bonds with the skin to continue killing germs even after washing.    Oral Hygiene is also important to reduce your risk of infection.  Remember - BRUSH YOUR TEETH THE MORNING OF SURGERY WITH YOUR REGULAR TOOTHPASTE  Please do not use if you have an allergy to CHG or antibacterial soaps. If your skin becomes reddened/irritated stop using the CHG.  Do not shave (including legs and underarms) for at least 48 hours prior to first CHG shower. It is OK to shave your face.  Please follow these instructions carefully.   Shower the NIGHT BEFORE SURGERY and the MORNING OF SURGERY  If you chose to wash your hair, wash your hair first as usual with your normal shampoo.  After you shampoo, rinse your hair and body thoroughly to remove the shampoo.  Use CHG Soap as you would any other liquid soap. You can apply CHG directly to the skin and wash gently with a scrungie or a clean washcloth.   Apply the CHG Soap to your body ONLY FROM THE NECK DOWN.  Do not use on open wounds or open sores. Avoid contact with your eyes, ears, mouth and genitals (private  parts). Wash Face and genitals (private parts)  with your normal soap.   Wash thoroughly, paying special attention to the area where your surgery will be performed.  Thoroughly rinse your body with warm water from the neck down.  DO NOT shower/wash with your normal soap after using and rinsing off the CHG Soap.  Pat yourself dry with a CLEAN TOWEL.  Wear CLEAN PAJAMAS to bed the night before surgery  Place CLEAN SHEETS on your bed the night before your surgery  DO NOT SLEEP WITH PETS.   Day of Surgery: Shower with CHG soap. Do not wear jewelry, make up, nail polish, gel polish, artificial nails, or any other type of  covering on natural nails including finger and toenails. If patients have artificial nails, gel coating, etc. that need to be removed by a nail salon please have this removed prior to surgery. Surgery may need to be canceled/delayed if the surgeon/anesthesiologist feels like the patient is unable to be adequately monitored. Do not wear lotions, powders, perfumes, or deodorant. Do not shave 48 hours prior to surgery.   Do not bring valuables to the hospital. [facility] is not responsible for any belongings or valuables. Wear Clean/Comfortable clothing the morning of surgery Remember to brush your teeth WITH YOUR REGULAR TOOTHPASTE.   Please read over the following fact sheets that you were given.   3 days prior to your procedure or After your COVID test   You are not required to quarantine however you are required to wear a well-fitting mask when you are out and around people not in your household. If your mask becomes wet or soiled, replace with a new one.   Wash your hands often with soap and water for 20 seconds or clean your hands with an alcohol-based hand sanitizer that contains at least 60% alcohol.   Do not share personal items.   Notify your provider:  o if you are in close contact with someone who has COVID  o or if you develop a fever of 100.4 or greater, sneezing, cough, sore throat, shortness of breath or body aches.

## 2021-09-16 ENCOUNTER — Other Ambulatory Visit (HOSPITAL_COMMUNITY): Payer: BC Managed Care – PPO

## 2021-09-16 ENCOUNTER — Other Ambulatory Visit: Payer: Self-pay

## 2021-09-16 ENCOUNTER — Encounter (HOSPITAL_COMMUNITY)
Admission: RE | Admit: 2021-09-16 | Discharge: 2021-09-16 | Disposition: A | Discharge status: Home / Self Care | Payer: BC Managed Care – PPO | Source: Ambulatory Visit | Attending: Neurosurgery | Admitting: Neurosurgery

## 2021-09-16 ENCOUNTER — Encounter (HOSPITAL_COMMUNITY): Payer: Self-pay

## 2021-09-16 VITALS — BP 139/100 | HR 78 | Temp 98.2°F | Resp 17 | Ht 64.0 in | Wt 119.7 lb

## 2021-09-16 DIAGNOSIS — I2510 Atherosclerotic heart disease of native coronary artery without angina pectoris: Secondary | ICD-10-CM | POA: Insufficient documentation

## 2021-09-16 DIAGNOSIS — Z01818 Encounter for other preprocedural examination: Secondary | ICD-10-CM | POA: Diagnosis present

## 2021-09-16 DIAGNOSIS — Z20822 Contact with and (suspected) exposure to covid-19: Secondary | ICD-10-CM | POA: Diagnosis not present

## 2021-09-16 HISTORY — DX: Anemia, unspecified: D64.9

## 2021-09-16 HISTORY — DX: Unspecified osteoarthritis, unspecified site: M19.90

## 2021-09-16 LAB — CBC
HCT: 39.4 % (ref 36.0–46.0)
Hemoglobin: 13 g/dL (ref 12.0–15.0)
MCH: 29.9 pg (ref 26.0–34.0)
MCHC: 33 g/dL (ref 30.0–36.0)
MCV: 90.6 fL (ref 80.0–100.0)
Platelets: 239 10*3/uL (ref 150–400)
RBC: 4.35 MIL/uL (ref 3.87–5.11)
RDW: 12.2 % (ref 11.5–15.5)
WBC: 5.6 10*3/uL (ref 4.0–10.5)
nRBC: 0 % (ref 0.0–0.2)

## 2021-09-16 LAB — SURGICAL PCR SCREEN
MRSA, PCR: NEGATIVE
Staphylococcus aureus: NEGATIVE

## 2021-09-16 LAB — BASIC METABOLIC PANEL
Anion gap: 7 (ref 5–15)
BUN: 10 mg/dL (ref 8–23)
CO2: 29 mmol/L (ref 22–32)
Calcium: 9.6 mg/dL (ref 8.9–10.3)
Chloride: 101 mmol/L (ref 98–111)
Creatinine, Ser: 0.8 mg/dL (ref 0.44–1.00)
GFR, Estimated: >60 mL/min (ref >60)
Glucose, Bld: 88 mg/dL (ref 70–99)
Potassium: 3.1 mmol/L — ABNORMAL LOW (ref 3.5–5.1)
Sodium: 137 mmol/L (ref 135–145)

## 2021-09-16 LAB — SARS CORONAVIRUS 2 (TAT 6-24 HRS): SARS Coronavirus 2: NEGATIVE

## 2021-09-16 NOTE — Progress Notes (Signed)
PCP - [place] Cardiologist - denies  PPM/ICD - n/a  Chest x-ray - n/a EKG - 09/16/21 Stress Test - denies ECHO - denies Cardiac Cath - denies  Sleep Study - denies CPAP - denies  Blood Thinner Instructions: n/a Aspirin Instructions: n/a  NPO  COVID TEST- 09/16/21, done in PAT  Anesthesia review: No  Patient denies shortness of breath, fever, cough and chest pain at PAT appointment   All instructions explained to the patient, with a verbal understanding of the material. Patient agrees to go over the instructions while at home for a better understanding. Patient also instructed to self quarantine after being tested for COVID-19. The opportunity to ask questions was provided.

## 2021-09-19 ENCOUNTER — Encounter (HOSPITAL_COMMUNITY): Payer: Self-pay | Admitting: Neurosurgery

## 2021-09-19 NOTE — Anesthesia Preprocedure Evaluation (Addendum)
Anesthesia Evaluation  Patient identified by MRN, date of birth, ID band Patient awake    Reviewed: Allergy & Precautions, NPO status , Patient's Chart, lab work & pertinent test results  Airway Mallampati: II  TM Distance: >3 FB     Dental   Pulmonary neg pulmonary ROS,    breath sounds clear to auscultation       Cardiovascular hypertension,  Rhythm:Regular Rate:Normal     Neuro/Psych History noted Dr. Nyoka Cowden    GI/Hepatic negative GI ROS, Neg liver ROS,   Endo/Other  negative endocrine ROS  Renal/GU negative Renal ROS     Musculoskeletal  (+) Arthritis ,   Abdominal   Peds  Hematology   Anesthesia Other Findings   Reproductive/Obstetrics                            Anesthesia Physical Anesthesia Plan  ASA: 3  Anesthesia Plan:    Post-op Pain Management:    Induction: Intravenous  PONV Risk Score and Plan: 3 and Ondansetron, Dexamethasone and Midazolam  Airway Management Planned: Oral ETT  Additional Equipment:   Intra-op Plan:   Post-operative Plan: Possible Post-op intubation/ventilation  Informed Consent: I have reviewed the patients History and Physical, chart, labs and discussed the procedure including the risks, benefits and alternatives for the proposed anesthesia with the patient or authorized representative who has indicated his/her understanding and acceptance.     Dental advisory given  Plan Discussed with: Anesthesiologist and CRNA  Anesthesia Plan Comments:        Anesthesia Quick Evaluation

## 2021-09-20 ENCOUNTER — Ambulatory Visit (HOSPITAL_COMMUNITY)
Admission: RE | Admit: 2021-09-20 | Discharge: 2021-09-20 | Disposition: A | Discharge status: Home / Self Care | Payer: BC Managed Care – PPO | Source: Ambulatory Visit | Attending: Neurosurgery | Admitting: Neurosurgery

## 2021-09-20 ENCOUNTER — Inpatient Hospital Stay (HOSPITAL_COMMUNITY): Payer: BC Managed Care – PPO | Admitting: Anesthesiology

## 2021-09-20 ENCOUNTER — Other Ambulatory Visit: Payer: Self-pay

## 2021-09-20 ENCOUNTER — Inpatient Hospital Stay (HOSPITAL_COMMUNITY)
Admission: RE | Disposition: A | Discharge status: Home / Self Care | Payer: Self-pay | Source: Home / Self Care | Attending: Neurosurgery

## 2021-09-20 ENCOUNTER — Inpatient Hospital Stay (HOSPITAL_COMMUNITY): Payer: BC Managed Care – PPO | Admitting: Emergency Medicine

## 2021-09-20 ENCOUNTER — Inpatient Hospital Stay (HOSPITAL_COMMUNITY)
Admission: RE | Admit: 2021-09-20 | Discharge: 2021-09-21 | DRG: 982 | Disposition: A | Discharge status: Home / Self Care | Payer: BC Managed Care – PPO | Attending: Neurosurgery | Admitting: Neurosurgery

## 2021-09-20 ENCOUNTER — Encounter (HOSPITAL_COMMUNITY): Payer: Self-pay | Admitting: Neurosurgery

## 2021-09-20 ENCOUNTER — Inpatient Hospital Stay (HOSPITAL_COMMUNITY): Payer: BC Managed Care – PPO

## 2021-09-20 DIAGNOSIS — D497 Neoplasm of unspecified behavior of endocrine glands and other parts of nervous system: Secondary | ICD-10-CM | POA: Diagnosis present

## 2021-09-20 DIAGNOSIS — M1990 Unspecified osteoarthritis, unspecified site: Secondary | ICD-10-CM | POA: Diagnosis present

## 2021-09-20 DIAGNOSIS — M810 Age-related osteoporosis without current pathological fracture: Secondary | ICD-10-CM | POA: Diagnosis present

## 2021-09-20 DIAGNOSIS — Z79899 Other long term (current) drug therapy: Secondary | ICD-10-CM | POA: Diagnosis not present

## 2021-09-20 DIAGNOSIS — Z419 Encounter for procedure for purposes other than remedying health state, unspecified: Secondary | ICD-10-CM

## 2021-09-20 DIAGNOSIS — I10 Essential (primary) hypertension: Secondary | ICD-10-CM | POA: Diagnosis present

## 2021-09-20 DIAGNOSIS — R202 Paresthesia of skin: Secondary | ICD-10-CM | POA: Diagnosis present

## 2021-09-20 DIAGNOSIS — G9529 Other cord compression: Secondary | ICD-10-CM | POA: Diagnosis present

## 2021-09-20 DIAGNOSIS — D334 Benign neoplasm of spinal cord: Secondary | ICD-10-CM

## 2021-09-20 DIAGNOSIS — D492 Neoplasm of unspecified behavior of bone, soft tissue, and skin: Secondary | ICD-10-CM | POA: Diagnosis present

## 2021-09-20 HISTORY — PX: LAMINECTOMY: SHX219

## 2021-09-20 HISTORY — PX: OPERATIVE ULTRASOUND: SHX5996

## 2021-09-20 SURGERY — THORACIC LAMINECTOMY FOR TUMOR
Anesthesia: General

## 2021-09-20 MED ORDER — LIDOCAINE 2% (20 MG/ML) 5 ML SYRINGE
INTRAMUSCULAR | Status: AC
  Filled 2021-09-20: qty 5

## 2021-09-20 MED ORDER — THROMBIN 5000 UNITS EX SOLR
CUTANEOUS | Status: AC
  Filled 2021-09-20: qty 5000

## 2021-09-20 MED ORDER — BUPIVACAINE HCL (PF) 0.5 % IJ SOLN
INTRAMUSCULAR | Status: AC
  Filled 2021-09-20: qty 30

## 2021-09-20 MED ORDER — MENTHOL 3 MG MT LOZG: 1.0000 | LOZENGE | OROMUCOSAL | Status: DC | PRN

## 2021-09-20 MED ORDER — MIDAZOLAM HCL 2 MG/2ML IJ SOLN
INTRAMUSCULAR | Status: DC | PRN
  Administered 2021-09-20: 2 mg via INTRAVENOUS

## 2021-09-20 MED ORDER — ONDANSETRON HCL 4 MG/2ML IJ SOLN
INTRAMUSCULAR | Status: AC
  Filled 2021-09-20: qty 2

## 2021-09-20 MED ORDER — DOCUSATE SODIUM 100 MG PO CAPS
100.0000 mg | ORAL_CAPSULE | Freq: Two times a day (BID) | ORAL | Status: DC
  Administered 2021-09-20 – 2021-09-21 (×3): 100 mg via ORAL
  Filled 2021-09-20 (×3): qty 1

## 2021-09-20 MED ORDER — PHENYLEPHRINE HCL-NACL 20-0.9 MG/250ML-% IV SOLN
INTRAVENOUS | Status: DC | PRN
Start: 2021-09-20 — End: 2021-09-20
  Administered 2021-09-20: 30 ug/min via INTRAVENOUS

## 2021-09-20 MED ORDER — HYDROCHLOROTHIAZIDE 12.5 MG PO TABS
12.5000 mg | ORAL_TABLET | Freq: Every day | ORAL | Status: DC
  Administered 2021-09-21: 12.5 mg via ORAL
  Filled 2021-09-20: qty 1

## 2021-09-20 MED ORDER — LIDOCAINE 2% (20 MG/ML) 5 ML SYRINGE
INTRAMUSCULAR | Status: DC | PRN
  Administered 2021-09-20: 100 mg via INTRAVENOUS

## 2021-09-20 MED ORDER — SODIUM CHLORIDE 0.9 % IV SOLN: 250.0000 mL | INTRAVENOUS | Status: DC

## 2021-09-20 MED ORDER — CEFAZOLIN SODIUM-DEXTROSE 2-4 GM/100ML-% IV SOLN
2.0000 g | Freq: Three times a day (TID) | INTRAVENOUS | Status: AC
  Administered 2021-09-20 (×2): 2 g via INTRAVENOUS
  Filled 2021-09-20 (×2): qty 100

## 2021-09-20 MED ORDER — FENTANYL CITRATE (PF) 250 MCG/5ML IJ SOLN
INTRAMUSCULAR | Status: DC | PRN
  Administered 2021-09-20: 150 ug via INTRAVENOUS
  Administered 2021-09-20 (×2): 50 ug via INTRAVENOUS

## 2021-09-20 MED ORDER — PROPOFOL 10 MG/ML IV BOLUS
INTRAVENOUS | Status: DC | PRN
  Administered 2021-09-20: 40 mg via INTRAVENOUS
  Administered 2021-09-20: 30 mg via INTRAVENOUS
  Administered 2021-09-20: 10 mg via INTRAVENOUS
  Administered 2021-09-20: 170 mg via INTRAVENOUS

## 2021-09-20 MED ORDER — HEMOSTATIC AGENTS (NO CHARGE) OPTIME
TOPICAL | Status: DC | PRN
  Administered 2021-09-20 (×2): 1 via TOPICAL

## 2021-09-20 MED ORDER — METHOCARBAMOL 1000 MG/10ML IJ SOLN
500.0000 mg | Freq: Four times a day (QID) | INTRAVENOUS | Status: DC | PRN
  Filled 2021-09-20: qty 5

## 2021-09-20 MED ORDER — ACETAMINOPHEN 650 MG RE SUPP: 650.0000 mg | RECTAL | Status: DC | PRN

## 2021-09-20 MED ORDER — SODIUM CHLORIDE 0.9% FLUSH: 3.0000 mL | INTRAVENOUS | Status: DC | PRN

## 2021-09-20 MED ORDER — CEFAZOLIN SODIUM-DEXTROSE 2-4 GM/100ML-% IV SOLN
2.0000 g | INTRAVENOUS | Status: AC
  Administered 2021-09-20: 2 g via INTRAVENOUS
  Filled 2021-09-20: qty 100

## 2021-09-20 MED ORDER — LIDOCAINE-EPINEPHRINE 1 %-1:100000 IJ SOLN
INTRAMUSCULAR | Status: DC | PRN
Start: 2021-09-20 — End: 2021-09-20
  Administered 2021-09-20: 5 mL

## 2021-09-20 MED ORDER — SUCCINYLCHOLINE CHLORIDE 200 MG/10ML IV SOSY
PREFILLED_SYRINGE | INTRAVENOUS | Status: DC | PRN
  Administered 2021-09-20: 120 mg via INTRAVENOUS

## 2021-09-20 MED ORDER — MIDAZOLAM HCL 2 MG/2ML IJ SOLN
INTRAMUSCULAR | Status: AC
  Filled 2021-09-20: qty 2

## 2021-09-20 MED ORDER — THROMBIN 5000 UNITS EX SOLR
CUTANEOUS | Status: DC | PRN
  Administered 2021-09-20: 10000 [IU] via TOPICAL

## 2021-09-20 MED ORDER — BUPIVACAINE HCL (PF) 0.5 % IJ SOLN
INTRAMUSCULAR | Status: DC | PRN
  Administered 2021-09-20: 5 mL

## 2021-09-20 MED ORDER — LACTATED RINGERS IV SOLN
INTRAVENOUS | Status: DC
Start: 2021-09-20 — End: 2021-09-20

## 2021-09-20 MED ORDER — ONDANSETRON HCL 4 MG/2ML IJ SOLN: 4.0000 mg | Freq: Four times a day (QID) | INTRAMUSCULAR | Status: DC | PRN

## 2021-09-20 MED ORDER — CHLORHEXIDINE GLUCONATE CLOTH 2 % EX PADS: 6.0000 | MEDICATED_PAD | Freq: Once | CUTANEOUS | Status: DC

## 2021-09-20 MED ORDER — LISINOPRIL-HYDROCHLOROTHIAZIDE 20-12.5 MG PO TABS: 1.0000 | ORAL_TABLET | Freq: Every day | ORAL | Status: DC

## 2021-09-20 MED ORDER — ONDANSETRON HCL 4 MG/2ML IJ SOLN
INTRAMUSCULAR | Status: DC | PRN
Start: 2021-09-20 — End: 2021-09-20
  Administered 2021-09-20: 4 mg via INTRAVENOUS

## 2021-09-20 MED ORDER — OXYCODONE HCL 5 MG PO TABS: 5.0000 mg | ORAL_TABLET | ORAL | Status: DC | PRN

## 2021-09-20 MED ORDER — CHLORHEXIDINE GLUCONATE 0.12 % MT SOLN
15.0000 mL | Freq: Once | OROMUCOSAL | Status: AC
  Administered 2021-09-20: 15 mL via OROMUCOSAL
  Filled 2021-09-20: qty 15

## 2021-09-20 MED ORDER — ADULT MULTIVITAMIN W/MINERALS CH
1.0000 | ORAL_TABLET | Freq: Every day | ORAL | Status: DC
  Administered 2021-09-21: 1 via ORAL
  Filled 2021-09-20: qty 1

## 2021-09-20 MED ORDER — GELATIN ABSORBABLE MT POWD
OROMUCOSAL | Status: DC | PRN
  Administered 2021-09-20: 5 mL via TOPICAL

## 2021-09-20 MED ORDER — MORPHINE SULFATE (PF) 2 MG/ML IV SOLN
2.0000 mg | INTRAVENOUS | Status: DC | PRN
  Administered 2021-09-20: 2 mg via INTRAVENOUS
  Filled 2021-09-20: qty 1

## 2021-09-20 MED ORDER — SODIUM CHLORIDE 0.9 % IV SOLN: INTRAVENOUS | Status: DC

## 2021-09-20 MED ORDER — LISINOPRIL 20 MG PO TABS
20.0000 mg | ORAL_TABLET | Freq: Every day | ORAL | Status: DC
  Administered 2021-09-21: 20 mg via ORAL
  Filled 2021-09-20: qty 1

## 2021-09-20 MED ORDER — PROPOFOL 500 MG/50ML IV EMUL
INTRAVENOUS | Status: DC | PRN
Start: 2021-09-20 — End: 2021-09-20
  Administered 2021-09-20: 125 ug/kg/min via INTRAVENOUS

## 2021-09-20 MED ORDER — SODIUM CHLORIDE 0.9% FLUSH
3.0000 mL | Freq: Two times a day (BID) | INTRAVENOUS | Status: DC
  Administered 2021-09-20 (×2): 3 mL via INTRAVENOUS

## 2021-09-20 MED ORDER — OXYCODONE HCL 5 MG PO TABS
10.0000 mg | ORAL_TABLET | ORAL | Status: DC | PRN
  Administered 2021-09-20 – 2021-09-21 (×5): 10 mg via ORAL
  Administered 2021-09-21: 5 mg via ORAL
  Filled 2021-09-20 (×6): qty 2

## 2021-09-20 MED ORDER — PROPOFOL 10 MG/ML IV BOLUS
INTRAVENOUS | Status: AC
  Filled 2021-09-20: qty 20

## 2021-09-20 MED ORDER — ROCURONIUM BROMIDE 10 MG/ML (PF) SYRINGE
PREFILLED_SYRINGE | INTRAVENOUS | Status: AC
  Filled 2021-09-20: qty 10

## 2021-09-20 MED ORDER — METHOCARBAMOL 500 MG PO TABS
500.0000 mg | ORAL_TABLET | Freq: Four times a day (QID) | ORAL | Status: DC | PRN
  Administered 2021-09-20 – 2021-09-21 (×3): 500 mg via ORAL
  Filled 2021-09-20 (×3): qty 1

## 2021-09-20 MED ORDER — ONDANSETRON HCL 4 MG PO TABS: 4.0000 mg | ORAL_TABLET | Freq: Four times a day (QID) | ORAL | Status: DC | PRN

## 2021-09-20 MED ORDER — DEXAMETHASONE SODIUM PHOSPHATE 10 MG/ML IJ SOLN
INTRAMUSCULAR | Status: AC
  Filled 2021-09-20: qty 1

## 2021-09-20 MED ORDER — PHENOL 1.4 % MT LIQD: 1.0000 | OROMUCOSAL | Status: DC | PRN

## 2021-09-20 MED ORDER — THROMBIN 20000 UNITS EX SOLR
CUTANEOUS | Status: AC
  Filled 2021-09-20: qty 20000

## 2021-09-20 MED ORDER — FENTANYL CITRATE (PF) 250 MCG/5ML IJ SOLN
INTRAMUSCULAR | Status: AC
  Filled 2021-09-20: qty 5

## 2021-09-20 MED ORDER — PHENYLEPHRINE HCL-NACL 20-0.9 MG/250ML-% IV SOLN
INTRAVENOUS | Status: AC
  Filled 2021-09-20: qty 500

## 2021-09-20 MED ORDER — BRIMONIDINE TARTRATE 0.025 % OP SOLN: 2.0000 [drp] | Freq: Two times a day (BID) | OPHTHALMIC | Status: DC | PRN

## 2021-09-20 MED ORDER — ACETAMINOPHEN 325 MG PO TABS
650.0000 mg | ORAL_TABLET | ORAL | Status: DC | PRN
  Administered 2021-09-20 – 2021-09-21 (×2): 650 mg via ORAL
  Filled 2021-09-20 (×2): qty 2

## 2021-09-20 MED ORDER — AMLODIPINE BESYLATE 5 MG PO TABS
5.0000 mg | ORAL_TABLET | Freq: Every day | ORAL | Status: DC
  Administered 2021-09-21: 5 mg via ORAL
  Filled 2021-09-20: qty 1

## 2021-09-20 MED ORDER — BISACODYL 10 MG RE SUPP: 10.0000 mg | Freq: Every day | RECTAL | Status: DC | PRN

## 2021-09-20 MED ORDER — PANTOPRAZOLE SODIUM 40 MG IV SOLR
40.0000 mg | Freq: Every day | INTRAVENOUS | Status: DC
  Administered 2021-09-20: 40 mg via INTRAVENOUS
  Filled 2021-09-20: qty 10

## 2021-09-20 MED ORDER — PROPOFOL 1000 MG/100ML IV EMUL
INTRAVENOUS | Status: AC
  Filled 2021-09-20: qty 300

## 2021-09-20 MED ORDER — BACITRACIN ZINC 500 UNIT/GM EX OINT
TOPICAL_OINTMENT | CUTANEOUS | Status: AC
  Filled 2021-09-20: qty 28.35

## 2021-09-20 MED ORDER — LIDOCAINE-EPINEPHRINE 1 %-1:100000 IJ SOLN
INTRAMUSCULAR | Status: AC
  Filled 2021-09-20: qty 1

## 2021-09-20 MED ORDER — DEXAMETHASONE SODIUM PHOSPHATE 10 MG/ML IJ SOLN
INTRAMUSCULAR | Status: DC | PRN
  Administered 2021-09-20: 10 mg via INTRAVENOUS

## 2021-09-20 MED ORDER — HYDROMORPHONE HCL 1 MG/ML IJ SOLN: 0.2500 mg | INTRAMUSCULAR | Status: DC | PRN

## 2021-09-20 MED ORDER — SENNA 8.6 MG PO TABS
1.0000 | ORAL_TABLET | Freq: Two times a day (BID) | ORAL | Status: DC
  Administered 2021-09-20 – 2021-09-21 (×3): 8.6 mg via ORAL
  Filled 2021-09-20 (×3): qty 1

## 2021-09-20 MED ORDER — ORAL CARE MOUTH RINSE: 15.0000 mL | Freq: Once | OROMUCOSAL | Status: AC

## 2021-09-20 SURGICAL SUPPLY — 77 items
ADH SKN CLS APL DERMABOND .7 (GAUZE/BANDAGES/DRESSINGS) ×1
APL SKNCLS STERI-STRIP NONHPOA (GAUZE/BANDAGES/DRESSINGS)
BAG COUNTER SPONGE SURGICOUNT (BAG) ×3 IMPLANT
BAG SPNG CNTER NS LX DISP (BAG) ×2
BAND INSRT 18 STRL LF DISP RB (MISCELLANEOUS) ×2
BAND RUBBER #18 3X1/16 STRL (MISCELLANEOUS) ×2 IMPLANT
BENZOIN TINCTURE PRP APPL 2/3 (GAUZE/BANDAGES/DRESSINGS) IMPLANT
BLADE SURG 11 STRL SS (BLADE) ×2 IMPLANT
BLADE ULTRA TIP 2M (BLADE) IMPLANT
BUR ACRON 5.0MM COATED (BURR) ×1 IMPLANT
BUR MATCHSTICK NEURO 3.0 LAGG (BURR) ×1 IMPLANT
CANISTER SUCT 3000ML PPV (MISCELLANEOUS) ×2 IMPLANT
CARTRIDGE OIL MAESTRO DRILL (MISCELLANEOUS) ×1 IMPLANT
CATH FOLEY 2WAY  3CC  8FR (CATHETERS) ×2
CATH FOLEY 2WAY 3CC 8FR (CATHETERS) IMPLANT
CLIP VESOCCLUDE MED 6/CT (CLIP) IMPLANT
COVER MAYO STAND STRL (DRAPES) IMPLANT
DERMABOND ADVANCED (GAUZE/BANDAGES/DRESSINGS) ×1
DERMABOND ADVANCED .7 DNX12 (GAUZE/BANDAGES/DRESSINGS) IMPLANT
DIFFUSER DRILL AIR PNEUMATIC (MISCELLANEOUS) ×2 IMPLANT
DRAPE LAPAROTOMY 100X72 PEDS (DRAPES) ×1 IMPLANT
DRAPE LAPAROTOMY 100X72X124 (DRAPES) IMPLANT
DRAPE MICROSCOPE LEICA (MISCELLANEOUS) ×1 IMPLANT
DRSG OPSITE POSTOP 4X6 (GAUZE/BANDAGES/DRESSINGS) ×1 IMPLANT
ELECT REM PT RETURN 9FT ADLT (ELECTROSURGICAL) ×2
ELECTRODE REM PT RTRN 9FT ADLT (ELECTROSURGICAL) ×1 IMPLANT
FEE INTRAOP CADWELL SUPPLY NCS (MISCELLANEOUS) IMPLANT
FEE INTRAOP MONITOR IMPULS NCS (MISCELLANEOUS) IMPLANT
GAUZE 4X4 16PLY ~~LOC~~+RFID DBL (SPONGE) ×1 IMPLANT
GAUZE SPONGE 4X4 12PLY STRL (GAUZE/BANDAGES/DRESSINGS) IMPLANT
GLOVE EXAM NITRILE XL STR (GLOVE) IMPLANT
GLOVE SURG ENC MOIS LTX SZ7.5 (GLOVE) IMPLANT
GLOVE SURG LTX SZ7 (GLOVE) ×2 IMPLANT
GLOVE SURG UNDER POLY LF SZ7.5 (GLOVE) ×3 IMPLANT
GOWN STRL REUS W/ TWL LRG LVL3 (GOWN DISPOSABLE) IMPLANT
GOWN STRL REUS W/ TWL XL LVL3 (GOWN DISPOSABLE) IMPLANT
GOWN STRL REUS W/TWL 2XL LVL3 (GOWN DISPOSABLE) IMPLANT
GOWN STRL REUS W/TWL LRG LVL3 (GOWN DISPOSABLE) ×6
GOWN STRL REUS W/TWL XL LVL3 (GOWN DISPOSABLE) ×2
HEMOSTAT POWDER KIT SURGIFOAM (HEMOSTASIS) ×2 IMPLANT
HEMOSTAT SURGICEL 2X14 (HEMOSTASIS) IMPLANT
INTRAOP CADWELL SUPPLY FEE NCS (MISCELLANEOUS) ×1
INTRAOP DISP SUPPLY FEE NCS (MISCELLANEOUS) ×2
INTRAOP MONITOR FEE IMPULS NCS (MISCELLANEOUS) ×1
INTRAOP MONITOR FEE IMPULSE (MISCELLANEOUS) ×2
KIT BASIN OR (CUSTOM PROCEDURE TRAY) ×2 IMPLANT
KIT TURNOVER KIT B (KITS) ×2 IMPLANT
KNIFE ARACHNOID DISP AM-24-S (MISCELLANEOUS) ×1 IMPLANT
NDL SPNL 18GX3.5 QUINCKE PK (NEEDLE) ×2 IMPLANT
NEEDLE HYPO 22GX1.5 SAFETY (NEEDLE) ×2 IMPLANT
NEEDLE SPNL 18GX3.5 QUINCKE PK (NEEDLE) ×2 IMPLANT
NS IRRIG 1000ML POUR BTL (IV SOLUTION) ×2 IMPLANT
OIL CARTRIDGE MAESTRO DRILL (MISCELLANEOUS) ×2
PACK LAMINECTOMY NEURO (CUSTOM PROCEDURE TRAY) ×2 IMPLANT
PAD ARMBOARD 7.5X6 YLW CONV (MISCELLANEOUS) ×5 IMPLANT
PATTIES SURGICAL .25X.25 (GAUZE/BANDAGES/DRESSINGS) ×1 IMPLANT
PATTIES SURGICAL .5 X.5 (GAUZE/BANDAGES/DRESSINGS) ×1 IMPLANT
PATTIES SURGICAL .5 X3 (DISPOSABLE) ×2 IMPLANT
PATTIES SURGICAL 1/4 X 3 (GAUZE/BANDAGES/DRESSINGS) ×1 IMPLANT
SEALANT ADHERUS EXTEND TIP (MISCELLANEOUS) ×1 IMPLANT
SPECIMEN JAR SMALL (MISCELLANEOUS) ×1 IMPLANT
SPONGE NEURO XRAY DETECT 1X3 (DISPOSABLE) IMPLANT
SPONGE SURGIFOAM ABS GEL SZ50 (HEMOSTASIS) ×1 IMPLANT
SPONGE T-LAP 4X18 ~~LOC~~+RFID (SPONGE) ×1 IMPLANT
STRIP CLOSURE SKIN 1/4X4 (GAUZE/BANDAGES/DRESSINGS) ×1 IMPLANT
SUT PROLENE 6 0 BV (SUTURE) ×2 IMPLANT
SUT VIC AB 0 CT1 18XCR BRD8 (SUTURE) ×1 IMPLANT
SUT VIC AB 0 CT1 8-18 (SUTURE) ×4
SUT VIC AB 2-0 CP2 18 (SUTURE) ×2 IMPLANT
SUT VICRYL 3-0 RB1 18 ABS (SUTURE) ×2 IMPLANT
TIP NONSTICK .5MMX23CM (INSTRUMENTS) ×2
TIP NONSTICK .5X23 (INSTRUMENTS) IMPLANT
TOWEL GREEN STERILE (TOWEL DISPOSABLE) ×2 IMPLANT
TOWEL GREEN STERILE FF (TOWEL DISPOSABLE) ×2 IMPLANT
TRAY FOLEY MTR SLVR 14FR STAT (SET/KITS/TRAYS/PACK) ×1 IMPLANT
TRAY FOLEY MTR SLVR 16FR STAT (SET/KITS/TRAYS/PACK) IMPLANT
WATER STERILE IRR 1000ML POUR (IV SOLUTION) ×2 IMPLANT

## 2021-09-20 NOTE — Anesthesia Postprocedure Evaluation (Signed)
Anesthesia Post Note  Patient: Dana Hays  Procedure(s) Performed: Tthoracic three- Thoracic four LAMINECTOMY, RESECTION OF INTRADURAL EXTRAMEDULLARY TUMOR OPERATIVE ULTRASOUND     Patient location during evaluation: PACU Anesthesia Type: General Level of consciousness: awake Pain management: pain level controlled Vital Signs Assessment: post-procedure vital signs reviewed and stable Cardiovascular status: stable Postop Assessment: no apparent nausea or vomiting Anesthetic complications: no   No notable events documented.  Last Vitals:  Vitals:   09/20/21 1300 09/20/21 1308  BP:  124/83  Pulse: 75 72  Resp: 18 17  Temp:    SpO2: 99% 100%    Last Pain:  Vitals:   09/20/21 1240  TempSrc:   PainSc: Asleep                 Mable Dara

## 2021-09-20 NOTE — Transfer of Care (Signed)
Immediate Anesthesia Transfer of Care Note  Patient: Alazay Leicht Zawistowski  Procedure(s) Performed: Tthoracic three- Thoracic four LAMINECTOMY, RESECTION OF INTRADURAL EXTRAMEDULLARY TUMOR OPERATIVE ULTRASOUND  Patient Location: PACU  Anesthesia Type:General  Level of Consciousness: drowsy  Airway & Oxygen Therapy: Patient connected to face mask oxygen  Post-op Assessment: Report given to RN and Post -op Vital signs reviewed and stable  Post vital signs: Reviewed, stable  Last Vitals:  Vitals Value Taken Time  BP    Temp    Pulse    Resp    SpO2      Last Pain:  Vitals:   09/20/21 0627  TempSrc:   PainSc: 0-No pain         Complications: No notable events documented.

## 2021-09-20 NOTE — Op Note (Addendum)
NEUROSURGERY OPERATIVE NOTE   PREOP DIAGNOSIS:  T3-T4 intradural extramedullary tumor   POSTOP DIAGNOSIS: Same  PROCEDURE: T3-T4 laminectomy, resection of extramedullary tumor Intraoperative electrophysiologic monitoring (SSEP, EMG, MEP) Use of intraoperative ultrasound guidance Use of intraoperative microscope for microdissection  SURGEON: Dr. [name], MD  ASSISTANT: Dr. Duffy Rhody, MD  ANESTHESIA: General Endotracheal  EBL: 100cc  SPECIMENS: Thoracic tumor for permanent pathology  DRAINS: None  COMPLICATIONS: None immediate  CONDITION: Hemodynamically stable to PACU  HISTORY: Dana Hays is a 61 y.o. female initially presented to the emergency department with interscapular back pain and lower extremity paresthesias.  She did undergo MRI demonstrating an intradural mass compressing the spinal cord at the T3-T4 level.  She further underwent a contrasted MRI revealing homogeneous enhancement suggesting a meningioma.  She was seen in the outpatient neurosurgery clinic where given her symptoms and radiographic findings surgical resection was recommended.  The risks, benefits, and alternatives to surgery as well as the expected postoperative course and recovery were all reviewed in detail with the patient and her husband.  After all their questions were answered informed consent was obtained and witnessed.  PROCEDURE IN DETAIL: The patient was brought to the operating room. After induction of general anesthesia, the patient was positioned on the operative table in the prone position. All pressure points were meticulously padded.  Care was taken to maintain neutral alignment of the neck.  Baseline somatosensory evoked potentials as well as motor evoked potentials were obtained after positioning and were noted to be monitorable.  Skin incision was then marked out and prepped and draped in the usual sterile fashion.  After timeout was conducted, a spinal needle was  introduced and intraoperative AP fluoroscopy was used to identify the surface projection of the T3 pedicle.  The midline skin incision was then infiltrated with local anesthetic with epinephrine and the skin was incised.  Bovie electrocautery was used to dissected subcutaneous tissue until the thoracodorsal fascia was identified and incised.  The spinous processes at T3 and T4 were then identified and subperiosteal dissection was carried out to the level of the transverse process.  A dissector was placed overlying the left T3 pedicle and another intraoperative AP fluoroscopy was taken to confirm our location at the correct level.  At this point, the high-speed drill and rongeurs were used to complete a laminectomy at T3 and T4.  Ligamentum flavum was identified and removed piecemeal with Kerrison punches.  The laminectomy was extended laterally to the level of the medial aspect of the pedicles.  Hemostasis on the epidural plane was secured with bipolar electrocautery and morselized Gelfoam with thrombin.  The intraoperative ultrasound was then brought into the field, the field was flooded with irrigation, and intraoperative ultrasound was used to identify the intradural tumor.  I did note good exposure of the tumor including the superior and inferior margins underlying the laminectomy defect.  At this point the microscope was draped sterilely and brought into the field and the remainder of the case was done under the microscope using microdissection technique.  A midline durotomy was made with the 11 blade scalpel.  Dural edges were tacked up with 4-0 Nurolon stitches.  The tumor was noted to be directly subjacent to the dura.  I was able to develop a plane between the tumor and the dura on the patient's left side.  I was then able to identify the inferior and superior margins of the tumor.  Initially, I began at the inferior margin of  the tumor and noted the exiting right sided T4 nerve roots.  These were  carefully dissected away from the inferior and lateral margin of the tumor.  This allowed slow mobilization of the tumor superiorly.  The tumor appeared to be attached to the dura on the dorsal lateral surface on the right side.  This was carefully coagulated and dissected sharply away from the dural plane.  After approximately half of the inferior portion of the tumor was dissected, I turned attention more to the superior aspect.  Again, the nerve roots were somewhat adherent to the lateral and inferior aspect of the tumor and these were carefully dissected away.  I was then able to slowly dissect the tumor away from the dural attachment laterally on the patient's right side.  The tumor was noted to be extending just slightly cranial to the exiting right T3 nerve roots.  Once this was done, I was able to remove the tumor en bloc which was sent for permanent pathology.  During tumor resection I did not note any changes in baseline SSEPs.  Once the tumor was removed we did run another motor evoked potential which was stable in comparison to the preresection baseline.  The resection cavity was then inspected and no further tumor was identified.  Bipolar electrocautery was used to coagulate the attachment on the dorsal lateral surface of the dura.  At this point the wound was irrigated with normal saline.  No active bleeding was identified.  The dura was then closed in a watertight fashion using a running 6-0 Prolene stitch.  Valsalva maneuver was performed without any active CSF egress.  The dural suture line was then covered with a layer of polyethylene glycol dural sealant.  Self-retaining retractors were then removed.  Hemostasis on the muscle edges was secured with bipolar electrocautery.  Wound was then closed in multiple layers using a combination of 0 and 3-0 Vicryl stitches.  A layer of Dermabond was placed.  After this dried a sterile dressing was applied.  At the end of the case all sponge, needle,  instrument, and cottonoid counts were correct.  The patient was then transferred to the stretcher and extubated.  He was then taken to the postanesthesia care unit in stable hemodynamic condition.   [name], MD [facility]

## 2021-09-20 NOTE — Anesthesia Procedure Notes (Signed)
Procedure Name: Intubation Date/Time: 09/20/2021 8:11 AM Performed by: Theresa Mulligan, RN Pre-anesthesia Checklist: Patient identified, Emergency Drugs available, Suction available and Patient being monitored Patient Re-evaluated:Patient Re-evaluated prior to induction Oxygen Delivery Method: Circle System Utilized Preoxygenation: Pre-oxygenation with 100% oxygen Induction Type: IV induction Ventilation: Mask ventilation without difficulty Laryngoscope Size: Mac and 3 Grade View: Grade I Tube type: Oral Tube size: 7.0 mm Number of attempts: 1 Airway Equipment and Method: Stylet Placement Confirmation: ETT inserted through vocal cords under direct vision, positive ETCO2 and breath sounds checked- equal and bilateral Secured at: 21 cm Tube secured with: Tape Dental Injury: Teeth and Oropharynx as per pre-operative assessment

## 2021-09-20 NOTE — H&P (Signed)
Chief Complaint  Tumor  History of Present Illness  Dana Hays is a 61 y.o. female initially presented to the emergency department several weeks ago with intrascapular back pain and leg paresthesias.  Her work-up included MRI of the thoracic spine revealing a intradural extramedullary lesion at the level of T3-T4 with significant spinal cord compression.  She further underwent MRI with contrast revealing the mass to be homogeneously contrast-enhancing likely meningioma.  Given the patient's symptoms and radiographic findings, surgical resection was recommended.  She therefore presents today for surgery.  Past Medical History   Past Medical History:  Diagnosis Date   Anemia    prior to menopause   Arthritis    Hypertension    Osteoporosis 03/17/2019    Past Surgical History   Past Surgical History:  Procedure Laterality Date   APPENDECTOMY     MYOMECTOMY      Social History   Social History   Tobacco Use   Smoking status: Never   Smokeless tobacco: Never  Vaping Use   Vaping Use: Never used  Substance Use Topics   Alcohol use: Never   Drug use: Never    Medications   Prior to Admission medications   Medication Sig Start Date End Date Taking? Authorizing Provider  amLODipine (NORVASC) 5 MG tablet Take 1 tablet (5 mg total) by mouth daily. 05/12/21  Yes Henson, Vickie L, PA-C  Brimonidine Tartrate (LUMIFY) 0.025 % SOLN Place 2 drops into both eyes 2 (two) times daily as needed (red eyes).   Yes [provider]  Calcium Carb-Cholecalciferol (CALCIUM 600/VITAMIN D PO) Take 1 tablet by mouth daily.   Yes [provider]  ibandronate (BONIVA) 150 MG tablet Take 150 mg by mouth every 30 (thirty) days. Take in the morning with a full glass of water, on an empty stomach, and do not take anything else by mouth or lie down for the next 30 min.   Yes [provider]  lisinopril-hydrochlorothiazide (ZESTORETIC) 20-12.5 MG tablet Take 1 tablet by mouth  daily. 05/12/21  Yes Henson, Vickie L, PA-C  Multiple Vitamins-Minerals (MULTIVITAMIN WITH MINERALS) tablet Take 1 tablet by mouth daily.   Yes [provider]    Allergies  No Known Allergies  Review of Systems  ROS  Neurologic Exam  Awake, alert, oriented Memory and concentration grossly intact Speech fluent, appropriate CN grossly intact Motor exam: Upper Extremities Deltoid Bicep Tricep Grip  Right 5/5 5/5 5/5 5/5  Left 5/5 5/5 5/5 5/5   Lower Extremities IP Quad PF DF EHL  Right 5/5 5/5 5/5 5/5 5/5  Left 5/5 5/5 5/5 5/5 5/5   Sensation grossly intact to LT  Imaging  MRI of the thoracic spine with and without contrast was personally reviewed and again demonstrates homogeneously enhancing dorsal intradural, extramedullary lesion at the level of T3-T4.  There is significant spinal cord compression  Impression  - 61 y.o. female presenting with back pain and lower extremity paresthesias related to large extra medullary likely meningioma at the level of T3-T4  Plan  -We will plan on proceeding with thoracic laminectomy for resection of tumor  I have reviewed the imaging findings with the patient.  We have discussed the details of surgery as well as the expected postoperative course and recovery.  We have also reviewed in detail the risks of the procedure.  All her questions today were answered.  She provided informed consent to proceed.   Dana Lose, MD [facility]

## 2021-09-21 ENCOUNTER — Encounter (HOSPITAL_COMMUNITY): Payer: Self-pay | Admitting: Neurosurgery

## 2021-09-21 LAB — CBC
HCT: 29.8 % — ABNORMAL LOW (ref 36.0–46.0)
Hemoglobin: 10.3 g/dL — ABNORMAL LOW (ref 12.0–15.0)
MCH: 30.9 pg (ref 26.0–34.0)
MCHC: 34.6 g/dL (ref 30.0–36.0)
MCV: 89.5 fL (ref 80.0–100.0)
Platelets: 184 10*3/uL (ref 150–400)
RBC: 3.33 MIL/uL — ABNORMAL LOW (ref 3.87–5.11)
RDW: 12.4 % (ref 11.5–15.5)
WBC: 9.9 10*3/uL (ref 4.0–10.5)
nRBC: 0 % (ref 0.0–0.2)

## 2021-09-21 LAB — BASIC METABOLIC PANEL
Anion gap: 6 (ref 5–15)
BUN: 9 mg/dL (ref 8–23)
CO2: 25 mmol/L (ref 22–32)
Calcium: 8.7 mg/dL — ABNORMAL LOW (ref 8.9–10.3)
Chloride: 104 mmol/L (ref 98–111)
Creatinine, Ser: 0.84 mg/dL (ref 0.44–1.00)
GFR, Estimated: >60 mL/min (ref >60)
Glucose, Bld: 120 mg/dL — ABNORMAL HIGH (ref 70–99)
Potassium: 3.6 mmol/L (ref 3.5–5.1)
Sodium: 135 mmol/L (ref 135–145)

## 2021-09-21 MED ORDER — OXYCODONE HCL 10 MG PO TABS: 10.0000 mg | ORAL_TABLET | Freq: Four times a day (QID) | ORAL | 0 refills | Status: DC | PRN

## 2021-09-21 MED ORDER — METHOCARBAMOL 500 MG PO TABS
500.0000 mg | ORAL_TABLET | Freq: Four times a day (QID) | ORAL | 0 refills | Status: DC | PRN
Start: 2021-09-21 — End: 2022-05-08

## 2021-09-21 MED ORDER — OXYCODONE HCL 10 MG PO TABS: 10.0000 mg | ORAL_TABLET | Freq: Four times a day (QID) | ORAL | 0 refills | Status: AC | PRN

## 2021-09-21 NOTE — Discharge Summary (Signed)
°  Physician Discharge Summary  Patient ID: Dana Hays MRN: [id number] DOB/AGE: Jul 14, 1961 61 y.o.  Admit date: 09/20/2021 Discharge date: 09/21/2021  Admission Diagnoses:  Thoracic tumor  Discharge Diagnoses:  Same Principal Problem:   Thoracic spine tumor   Discharged Condition: Stable  Hospital Course:  Dana Hays is a 61 y.o. female Electively admitted after T3-T4 laminectomy for resection of extramedullary tumor.  Patient was at neurologic baseline postoperatively.  She was ambulating well.  She was voiding normally.  She had back pain controlled with oral medication.  She was therefore discharged in stable condition.  Treatments: Surgery -   Laminectomy T3-T4 for resection of extramedullary tumor  Discharge Exam: Blood pressure 111/71, pulse 70, temperature 98.1 F (36.7 C), temperature source Oral, resp. rate 18, SpO2 98 %. Awake, alert, oriented Speech fluent, appropriate CN grossly intact 5/5 BUE/BLE Wound c/d/i  Disposition: Discharge disposition: 01-Home or Self Care       Discharge Instructions     Call MD for:  redness, tenderness, or signs of infection (pain, swelling, redness, odor or green/yellow discharge around incision site)   Complete by: As directed    Call MD for:  temperature >100.4   Complete by: As directed    Diet - low sodium heart healthy   Complete by: As directed    Discharge instructions   Complete by: As directed    Walk at home as much as possible, at least 4 times / day   Increase activity slowly   Complete by: As directed    Lifting restrictions   Complete by: As directed    No lifting > 10 lbs   May shower / Bathe   Complete by: As directed    48 hours after surgery   May walk up steps   Complete by: As directed    Other Restrictions   Complete by: As directed    No bending/twisting at waist   Remove dressing in 24 hours   Complete by: As directed       Allergies as of 09/21/2021   No Known Allergies       Medication List     TAKE these medications    amLODipine 5 MG tablet Commonly known as: NORVASC Take 1 tablet (5 mg total) by mouth daily.   CALCIUM 600/VITAMIN D PO Take 1 tablet by mouth daily.   ibandronate 150 MG tablet Commonly known as: BONIVA Take 150 mg by mouth every 30 (thirty) days. Take in the morning with a full glass of water, on an empty stomach, and do not take anything else by mouth or lie down for the next 30 min.   lisinopril-hydrochlorothiazide 20-12.5 MG tablet Commonly known as: Zestoretic Take 1 tablet by mouth daily.   Lumify 0.025 % Soln Generic drug: Brimonidine Tartrate Place 2 drops into both eyes 2 (two) times daily as needed (red eyes).   methocarbamol 500 MG tablet Commonly known as: ROBAXIN Take 1 tablet (500 mg total) by mouth every 6 (six) hours as needed for muscle spasms.   multivitamin with minerals tablet Take 1 tablet by mouth daily.   Oxycodone HCl 10 MG Tabs Take 1 tablet (10 mg total) by mouth every 6 (six) hours as needed for up to 7 days for severe pain ((score 7 to 10)).         SignedJairo Ben 09/21/2021, 9:31 AM

## 2021-09-21 NOTE — Care Management (Signed)
Plan for discharge to home. No HH needs identified and unit staff will provide any DME needed from [facility]stock.

## 2021-09-21 NOTE — Plan of Care (Signed)
Pt and husband given D/C instructions with verbal understanding. Rx's were sent to the pharmacy by MD. Pt's incision is clean and dry with no sign of infection. Pt's IV was removed prior to D/C. Pt D/C'd home via wheelchair per MD order. Pt is stable @ D/C and has no other needs at this time. Dominiq Fontaine, RN 

## 2021-09-21 NOTE — Evaluation (Signed)
Physical Therapy Evaluation & Discharge Patient Details Name: Dana Hays MRN: [id number] DOB: Apr 06, 1961 Today's Date: 09/21/2021  History of Present Illness  61 y/o female admitted on 09/20/21 following T3-4 laminectomy for resection of extramedullary tumor at level of T3-4 with significant cord compression. PMH: HTN, osteoporosis.  Clinical Impression  Patient admitted following above procedure. Patient functioning at modI level with no AD for all mobility. Educated patient and husband on back precautions and progressive walking program, patient and husband verbalized understanding. No further skilled PT needs required acutely. No PT follow up recommended at this time.      Recommendations for follow up therapy are one component of a multi-disciplinary discharge planning process, led by the attending physician.  Recommendations may be updated based on patient status, additional functional criteria and insurance authorization.  Follow Up Recommendations No PT follow up    Assistance Recommended at Discharge PRN  Patient can return home with the following       Equipment Recommendations None recommended by PT  Recommendations for Other Services       Functional Status Assessment Patient has had a recent decline in their functional status and demonstrates the ability to make significant improvements in function in a reasonable and predictable amount of time.     Precautions / Restrictions Precautions Precautions: Fall;Back Precaution Booklet Issued: Yes (comment) Restrictions Weight Bearing Restrictions: No      Mobility  Bed Mobility Overal bed mobility: Modified Independent                  Transfers Overall transfer level: Modified independent Equipment used: None               General transfer comment: increased time to complete    Ambulation/Gait Ambulation/Gait assistance: Modified independent (Device/Increase time) Gait Distance (Feet): 300  Feet Assistive device: None Gait Pattern/deviations: WFL(Within Functional Limits)   Gait velocity interpretation: >4.37 ft/sec, indicative of normal walking speed      Stairs Stairs: Yes Stairs assistance: Modified independent (Device/Increase time) Stair Management: One rail Right, Alternating pattern, Forwards Number of Stairs: 2    Wheelchair Mobility    Modified Rankin (Stroke Patients Only)       Balance Overall balance assessment: Mild deficits observed, not formally tested                                           Pertinent Vitals/Pain Pain Assessment Pain Assessment: Faces Faces Pain Scale: Hurts little more Pain Location: sx site Pain Descriptors / Indicators: Discomfort, Guarding Pain Intervention(s): Monitored during session    Home Living Family/patient expects to be discharged to:: Private residence Living Arrangements: Spouse/significant other Available Help at Discharge: Family;Available 24 hours/day Type of Home: House Home Access: Stairs to enter Entrance Stairs-Rails: Right Entrance Stairs-Number of Steps: 2 Alternate Level Stairs-Number of Steps: flight Home Layout: Two level;Able to live on main level with bedroom/bathroom Home Equipment: None      Prior Function Prior Level of Function : Independent/Modified Independent             Mobility Comments: no AD ADLs Comments: indep     Hand Dominance   Dominant Hand: Right    Extremity/Trunk Assessment   Upper Extremity Assessment Upper Extremity Assessment: Defer to OT evaluation    Lower Extremity Assessment Lower Extremity Assessment: Overall WFL for tasks assessed    Cervical /  Trunk Assessment Cervical / Trunk Assessment: Neck Surgery  Communication   Communication: No difficulties  Cognition Arousal/Alertness: Awake/alert Behavior During Therapy: WFL for tasks assessed/performed Overall Cognitive Status: Within Functional Limits for tasks  assessed                                          General Comments General comments (skin integrity, edema, etc.): VSS on RA, husband present and supportive    Exercises     Assessment/Plan    PT Assessment Patient does not need any further PT services  PT Problem List         PT Treatment Interventions      PT Goals (Current goals can be found in the Care Plan section)  Acute Rehab PT Goals Patient Stated Goal: to go home PT Goal Formulation: All assessment and education complete, DC therapy    Frequency       Co-evaluation               AM-PAC PT "6 Clicks" Mobility  Outcome Measure Help needed turning from your back to your side while in a flat bed without using bedrails?: None Help needed moving from lying on your back to sitting on the side of a flat bed without using bedrails?: None Help needed moving to and from a bed to a chair (including a wheelchair)?: None Help needed standing up from a chair using your arms (e.g., wheelchair or bedside chair)?: None Help needed to walk in hospital room?: None Help needed climbing 3-5 steps with a railing? : None 6 Click Score: 24    End of Session   Activity Tolerance: Patient tolerated treatment well Patient left: in bed;with call bell/phone within reach;with family/visitor present Nurse Communication: Mobility status PT Visit Diagnosis: Muscle weakness (generalized) (M62.81)    Time: 0277-4128 PT Time Calculation (min) (ACUTE ONLY): 12 min   Charges:   PT Evaluation $PT Eval Low Complexity: 1 Low          Dana Hays PT, DPT Acute Rehabilitation Services Pager [phone] Office [phone]   Dana Hays 09/21/2021, 10:20 AM

## 2021-09-21 NOTE — Evaluation (Signed)
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 PMH: HTN, osteoporosis.   Clinical Impression   Dana Hays was evaluated s/p the above back surgery, she is completely indep at baseline. She lives in Dana 2 level home with 2 STE, bed/bath on main level, with her husband who can assist as needed at d/c. Upon evaluation pt demonstrated set up - supervision level ADLs with cues to maintain back precautions. After review, pt demonstrated great understanding of back precautions and good carry over to functional tasks. Pt does not require further acute OT. Recommend d/c to home with support of family, no follow up OT needed.     Recommendations for follow up therapy are one component of Dana multi-disciplinary discharge planning process, led by the attending physician.  Recommendations may be updated based on patient status, additional functional criteria and insurance authorization.   Follow Up Recommendations  No OT follow up    Assistance Recommended at Discharge Intermittent Supervision/Assistance  Patient can return home with the following Dana little help with walking and/or transfers;Dana little help with bathing/dressing/bathroom;Assist for transportation;Help with stairs or ramp for entrance    Functional Status Assessment  Patient has had Dana recent decline in their functional status and demonstrates the ability to make significant improvements in function in Dana reasonable and predictable amount of time.  Equipment Recommendations  None recommended by OT       Precautions / Restrictions Precautions Precautions: Fall;Back Precaution Booklet Issued: Yes (comment) Restrictions Weight Bearing Restrictions: No      Mobility Bed Mobility Overal  bed mobility: Needs Assistance Bed Mobility: Rolling, Sidelying to Sit, Sit to Sidelying Rolling: Supervision Sidelying to sit: Supervision     Sit to sidelying: Supervision General bed mobility comments: cues for log roll    Transfers Overall transfer level: Needs assistance Equipment used: None Transfers: Sit to/from Stand Sit to Stand: Supervision           General transfer comment: slow/deliberate      Balance Overall balance assessment: Mild deficits observed, not formally tested       ADL either performed or assessed with clinical judgement   ADL Overall ADL's : Needs assistance/impaired     General ADL Comments: Pt demonstrated set up - supervision level ADLs with cues for compensatotry techniques to adhere to back precautions. Pt able to maintain figure four position with BLE for LB dressing. Great understanding of back precautions, limited by pain this date.     Vision Baseline Vision/History: 0 No visual deficits Ability to See in Adequate Light: 0 Adequate Vision Assessment?: No apparent visual deficits   Pertinent Vitals/Pain Pain Assessment Pain Assessment: Faces Faces Pain Scale: Hurts little more Pain Location: sx site Pain Descriptors / Indicators: Discomfort, Guarding Pain Intervention(s): Limited activity within patient's tolerance, Monitored during session     Hand Dominance Right   Extremity/Trunk Assessment Upper Extremity Assessment Upper Extremity Assessment: Overall WFL for tasks assessed   Lower Extremity Assessment Lower Extremity Assessment: Defer to PT evaluation   Cervical / Trunk Assessment Cervical / Trunk Assessment: Neck Surgery   Communication Communication Communication: No difficulties   Cognition Arousal/Alertness: Awake/alert Behavior During Therapy: WFL for tasks assessed/performed Overall Cognitive Status: Within Functional Limits for tasks assessed           General  Comments: flat affect but overall WFL      General Comments  VSS on RA, husband present and supportive            Home Living Family/patient expects to be discharged to:: Private residence Living Arrangements: Spouse/significant other Available Help at Discharge: Family;Available 24 hours/day Type of Home: House Home Access: Stairs to enter [employer] of Steps: 2   Home Layout: Two level;Able to live on main level with bedroom/bathroom Alternate Level Stairs-Number of Steps: flight   Bathroom Shower/Tub: Occupational psychologist: Standard     Home Equipment: None          Prior Functioning/Environment Prior Level of Function : Independent/Modified Independent             Mobility Comments: no AD ADLs Comments: indep        OT Problem List: Decreased strength;Decreased range of motion;Decreased activity tolerance;Decreased knowledge of precautions;Pain      OT Treatment/Interventions:      OT Goals(Current goals can be found in the care plan section) Acute Rehab OT Goals Patient Stated Goal: home OT Goal Formulation: With patient         AM-PAC OT "6 Clicks" Daily Activity     Outcome Measure Help from another person eating meals?: None Help from another person taking care of personal grooming?: Dana Little Help from another person toileting, which includes using toliet, bedpan, or urinal?: Dana Little Help from another person bathing (including washing, rinsing, drying)?: Dana Little Help from another person to put on and taking off regular upper body clothing?: None Help from another person to put on and taking off regular lower body clothing?: Dana Little 6 Click Score: 20   End of Session Nurse Communication: Mobility status  Activity Tolerance: Patient tolerated treatment well;Patient limited by pain Patient left: in bed;with call bell/phone within reach;with family/visitor present  OT Visit Diagnosis: Unsteadiness on feet (R26.81);Other abnormalities of gait and mobility  (R26.89);Muscle weakness (generalized) (M62.81);Pain                Time: 8921-1941 OT Time Calculation (min): 18 min Charges:  OT General Charges $OT Visit: 1 Visit OT Evaluation $OT Eval Low Complexity: 1 Low   Dana Hays Dana Hays 09/21/2021, 9:39 AM

## 2021-09-21 NOTE — Progress Notes (Signed)
°  NEUROSURGERY PROGRESS NOTE   No issues overnight. Pt with some nausea this am. No new N/T/W BLE. Ambulating to BR, voiding.   EXAM:  BP 111/71 (BP Location: Left Arm)    Pulse 70    Temp 98.1 F (36.7 C) (Oral)    Resp 18    SpO2 98%   Awake, alert, oriented  Speech fluent, appropriate  CN grossly intact  5/5 BUE/BLE  Wound c/d/i  IMPRESSION:  61 y.o. female POD#1 T3-4 lami for resection of extramedullary tumor, doing well  PLAN: - PT/OT eval today - Can likely d/c home later today   [name], MD [facility]

## 2021-09-22 LAB — SURGICAL PATHOLOGY

## 2021-09-22 MED FILL — Thrombin For Soln 5000 Unit: CUTANEOUS | Qty: 5000 | Status: AC

## 2021-10-10 ENCOUNTER — Other Ambulatory Visit: Payer: Self-pay

## 2021-10-10 ENCOUNTER — Other Ambulatory Visit: Payer: Self-pay | Admitting: Physician Assistant

## 2021-10-10 ENCOUNTER — Encounter: Payer: Self-pay | Admitting: Physician Assistant

## 2021-10-10 ENCOUNTER — Telehealth: Payer: Self-pay | Admitting: Family Medicine

## 2021-10-10 DIAGNOSIS — I10 Essential (primary) hypertension: Secondary | ICD-10-CM

## 2021-10-10 MED ORDER — AMLODIPINE BESYLATE 5 MG PO TABS: 5.0000 mg | ORAL_TABLET | Freq: Every day | ORAL | 2 refills | Status: DC

## 2021-10-10 MED ORDER — LISINOPRIL-HYDROCHLOROTHIAZIDE 20-12.5 MG PO TABS: 1.0000 | ORAL_TABLET | Freq: Every day | ORAL | 3 refills | Status: DC

## 2021-10-10 NOTE — Telephone Encounter (Signed)
Pt need refill of Amlodipine she only has one pill left.  [facility] [address]  Pt has fasting cpe with you in October. ?

## 2021-10-10 NOTE — Telephone Encounter (Signed)
I sent a refill of amlodipine, #90 with 2 refills to last until appt in 04/2022.

## 2021-10-29 DIAGNOSIS — Z1211 Encounter for screening for malignant neoplasm of colon: Secondary | ICD-10-CM

## 2021-10-29 HISTORY — DX: Encounter for screening for malignant neoplasm of colon: Z12.11

## 2021-11-03 ENCOUNTER — Other Ambulatory Visit: Payer: Self-pay

## 2021-11-03 DIAGNOSIS — Z1211 Encounter for screening for malignant neoplasm of colon: Secondary | ICD-10-CM

## 2021-11-07 ENCOUNTER — Other Ambulatory Visit (INDEPENDENT_AMBULATORY_CARE_PROVIDER_SITE_OTHER): Payer: BC Managed Care – PPO

## 2021-11-07 DIAGNOSIS — Z23 Encounter for immunization: Secondary | ICD-10-CM

## 2021-11-07 LAB — COLOGUARD: Cologuard: NEGATIVE

## 2021-11-09 ENCOUNTER — Encounter: Payer: Self-pay | Admitting: Physician Assistant

## 2021-11-16 ENCOUNTER — Encounter: Payer: Self-pay | Admitting: Physician Assistant

## 2021-11-23 LAB — COLOGUARD: COLOGUARD: NEGATIVE

## 2021-12-06 ENCOUNTER — Encounter: Payer: Self-pay | Admitting: Physician Assistant

## 2021-12-07 ENCOUNTER — Encounter: Payer: Self-pay | Admitting: Physician Assistant

## 2022-01-27 ENCOUNTER — Encounter: Payer: Self-pay | Admitting: Internal Medicine

## 2022-02-05 ENCOUNTER — Other Ambulatory Visit: Payer: Self-pay | Admitting: Physician Assistant

## 2022-02-05 DIAGNOSIS — I10 Essential (primary) hypertension: Secondary | ICD-10-CM

## 2022-02-08 LAB — HM MAMMOGRAPHY

## 2022-02-08 LAB — HM PAP SMEAR: HM Pap smear: NEGATIVE

## 2022-03-04 ENCOUNTER — Other Ambulatory Visit: Payer: Self-pay | Admitting: Medical

## 2022-03-04 DIAGNOSIS — I10 Essential (primary) hypertension: Secondary | ICD-10-CM

## 2022-04-05 ENCOUNTER — Encounter: Payer: Self-pay | Admitting: Internal Medicine

## 2022-05-08 ENCOUNTER — Encounter: Payer: Self-pay | Admitting: Medical

## 2022-05-08 ENCOUNTER — Ambulatory Visit (INDEPENDENT_AMBULATORY_CARE_PROVIDER_SITE_OTHER): Payer: BC Managed Care – PPO | Admitting: Medical

## 2022-05-08 VITALS — BP 120/72 | HR 63 | Ht 64.25 in | Wt 121.0 lb

## 2022-05-08 DIAGNOSIS — Z23 Encounter for immunization: Secondary | ICD-10-CM | POA: Diagnosis not present

## 2022-05-08 DIAGNOSIS — R7989 Other specified abnormal findings of blood chemistry: Secondary | ICD-10-CM

## 2022-05-08 DIAGNOSIS — Z1329 Encounter for screening for other suspected endocrine disorder: Secondary | ICD-10-CM

## 2022-05-08 DIAGNOSIS — Z13220 Encounter for screening for lipoid disorders: Secondary | ICD-10-CM

## 2022-05-08 DIAGNOSIS — Z2821 Immunization not carried out because of patient refusal: Secondary | ICD-10-CM | POA: Diagnosis not present

## 2022-05-08 DIAGNOSIS — I10 Essential (primary) hypertension: Secondary | ICD-10-CM

## 2022-05-08 DIAGNOSIS — M810 Age-related osteoporosis without current pathological fracture: Secondary | ICD-10-CM

## 2022-05-08 DIAGNOSIS — Z0000 Encounter for general adult medical examination without abnormal findings: Secondary | ICD-10-CM | POA: Diagnosis not present

## 2022-05-08 DIAGNOSIS — Z862 Personal history of diseases of the blood and blood-forming organs and certain disorders involving the immune mechanism: Secondary | ICD-10-CM

## 2022-05-08 NOTE — Addendum Note (Signed)
Addended by: Minette Headland A on: 05/08/2022 04:11 PM   Modules accepted: Orders

## 2022-05-08 NOTE — Progress Notes (Signed)
Subjective:   HPI  Dana Hays is a 61 y.o. female who presents for Chief Complaint  Patient presents with   fasting cpe    Fasting cpe, obgyn-Dr. Molli Posey, declines flu shot. No concerns    Patient Care Team: Burk Hoctor, Camelia Eng, PA-C as PCP - General (Family Medicine) Alda Berthold, DO as Consulting Physician (Neurology) Sees dentist Sees eye doctor Dr. Molli Posey, gynecology Dr. [name], neurosurgery   Concerns: None, doing well  Has questions about whether she could come off BP medications  Her and husband are part of a cycle group.  Does variety of exercise.  On boniva for osteoporosis at least the last few years  Past Medical History:  Diagnosis Date   Anemia    prior to menopause   Arthritis    Colon cancer screening 10/2021   cologuard negative   Hypertension    Osteoporosis 03/17/2019   Pterygium    bilat, causes redness, uses drops   Uncontrolled hypertension 09/04/2019    Family History  Problem Relation Age of Onset   Cancer Mother        ovarian   Hypertension Mother    Hypertension Father    Arthritis Father    Dementia Father 61   Bipolar disorder Sister    Heart disease Neg Hx    Stroke Neg Hx      Current Outpatient Medications:    amLODipine (NORVASC) 5 MG tablet, Take 1 tablet (5 mg total) by mouth daily., Disp: 90 tablet, Rfl: 2   Brimonidine Tartrate (LUMIFY) 0.025 % SOLN, Place 2 drops into both eyes 2 (two) times daily as needed (red eyes)., Disp: , Rfl:    Calcium Carb-Cholecalciferol (CALCIUM 600/VITAMIN D PO), Take 1 tablet by mouth daily., Disp: , Rfl:    ibandronate (BONIVA) 150 MG tablet, Take 150 mg by mouth every 30 (thirty) days. Take in the morning with a full glass of water, on an empty stomach, and do not take anything else by mouth or lie down for the next 30 min., Disp: , Rfl:    lisinopril-hydrochlorothiazide (ZESTORETIC) 20-12.5 MG tablet, Take 1 tablet by mouth once daily, Disp: 30 tablet,  Rfl: 1   Multiple Vitamins-Minerals (MULTIVITAMIN WITH MINERALS) tablet, Take 1 tablet by mouth daily., Disp: , Rfl:   No Known Allergies     Reviewed their medical, surgical, family, social, medication, and allergy history and updated chart as appropriate.   Review of Systems Constitutional: -fever, -chills, -sweats, -unexpected weight change, -decreased appetite, -fatigue Allergy: -sneezing, -itching, -congestion Dermatology: -changing moles, --rash, -lumps ENT: -runny nose, -ear pain, -sore throat, -hoarseness, -sinus pain, -teeth pain, - ringing in ears, -hearing loss, -nosebleeds Cardiology: -chest pain, -palpitations, -swelling, -difficulty breathing when lying flat, -waking up short of breath Respiratory: -cough, -shortness of breath, -difficulty breathing with exercise or exertion, -wheezing, -coughing up blood Gastroenterology: -abdominal pain, -nausea, -vomiting, -diarrhea, -constipation, -blood in stool, -changes in bowel movement, -difficulty swallowing or eating Hematology: -bleeding, -bruising  Musculoskeletal: -joint aches, -muscle aches, -joint swelling, -back pain, -neck pain, -cramping, -changes in gait Ophthalmology: denies vision changes, eye redness, itching, discharge Urology: -burning with urination, -difficulty urinating, -blood in urine, -urinary frequency, -urgency, -incontinence Neurology: -headache, -weakness, -tingling, -numbness, -memory loss, -falls, -dizziness Psychology: -depressed mood, -agitation, -sleep problems Breast/gyn: -breast tendnerss, -discharge, -lumps, -vaginal discharge,- irregular periods, -heavy periods      05/08/2022   11:28 AM 05/05/2021    3:39 PM 03/10/2020    8:28 AM 09/04/2019  11:28 AM 03/03/2019    8:36 AM  Depression screen PHQ 2/9  Decreased Interest 0 0 0 0 0  Down, Depressed, Hopeless 0 0 0 0 0  PHQ - 2 Score 0 0 0 0 0       Objective:  BP 120/72   Pulse 63   Ht 5' 4.25" (1.632 m)   Wt 121 lb (54.9 kg)   BMI 20.61  kg/m   BP Readings from Last 3 Encounters:  05/08/22 120/72  09/21/21 116/71  09/16/21 (!) 139/100   Wt Readings from Last 3 Encounters:  05/08/22 121 lb (54.9 kg)  09/16/21 119 lb 11.2 oz (54.3 kg)  06/10/21 124 lb (56.2 kg)    General appearance: alert, no distress, WD/WN, African American female Skin: unremarkable HEENT: normocephalic, conjunctiva/corneas normal, sclerae anicteric, PERRLA, EOMi, nares patent, no discharge or erythema, pharynx normal Oral cavity: MMM, tongue normal, teeth in good repair Neck: supple, no lymphadenopathy, no thyromegaly, no masses, normal ROM, no bruits Chest: non tender, normal shape and expansion Heart: RRR, normal S1, S2, no murmurs Lungs: CTA bilaterally, no wheezes, rhonchi, or rales Abdomen: +bs, soft, non tender, non distended, no masses, no hepatomegaly, no splenomegaly, no bruits Back: non tender, normal ROM, no scoliosis Musculoskeletal: upper extremities non tender, no obvious deformity, normal ROM throughout, lower extremities non tender, no obvious deformity, normal ROM throughout Extremities: no edema, no cyanosis, no clubbing Pulses: 2+ symmetric, upper and lower extremities, normal cap refill Neurological: alert, oriented x 3, CN2-12 intact, strength normal upper extremities and lower extremities, sensation normal throughout, DTRs 2+ throughout, no cerebellar signs, gait normal Psychiatric: normal affect, behavior normal, pleasant  Breast/gyn/rectal - deferred to gynecology      Assessment and Plan :   Encounter Diagnoses  Name Primary?   Encounter for health maintenance examination in adult Yes   Need for Tdap vaccination    Influenza vaccination declined    Osteoporosis, unspecified osteoporosis type, unspecified pathological fracture presence    History of anemia    Screening for thyroid disorder    Screening for lipid disorders    Abnormal thyroid blood test    Primary hypertension      This visit was a  preventative care visit, also known as wellness visit or routine physical.   Topics typically include healthy lifestyle, diet, exercise, preventative care, vaccinations, sick and well care, proper use of emergency dept and after hours care, as well as other concerns.     Recommendations: Continue to return yearly for your annual wellness and preventative care visits.  This gives [place] a chance to discuss healthy lifestyle, exercise, vaccinations, review your chart record, and perform screenings where appropriate.  I recommend you see your eye doctor yearly for routine vision care.  I recommend you see your dentist yearly for routine dental care including hygiene visits twice yearly.   Vaccination recommendations were reviewed Immunization History  Administered Date(s) Administered   PFIZER(Purple Top)SARS-COV-2 Vaccination 09/25/2019, 10/21/2019, 05/11/2020   Pfizer Covid-19 Vaccine Bivalent Booster 58yr & up 04/13/2021   Zoster Recombinat (Shingrix) 05/05/2021, 11/07/2021    You decline flu shot today  Counseled on the Tdap (tetanus, diptheria, and acellular pertussis) vaccine.  Vaccine information sheet given. Tdap vaccine given after consent obtained.   Screening for cancer: Colon cancer screening: Cologuard negative 10/2021  Breast cancer screening: You should perform a self breast exam monthly.   We reviewed recommendations for regular mammograms and breast cancer screening.  Cervical cancer screening: We reviewed recommendations  for pap smear screening.   Skin cancer screening: Check your skin regularly for new changes, growing lesions, or other lesions of concern Come in for evaluation if you have skin lesions of concern.  Lung cancer screening: If you have a greater than 20 pack year history of tobacco use, then you may qualify for lung cancer screening with a chest CT scan.   Please call your insurance company to inquire about coverage for this test.  We currently  don't have screenings for other cancers besides breast, cervical, colon, and lung cancers.  If you have a strong family history of cancer or have other cancer screening concerns, please let me know.    Bone health: Get at least 150 minutes of aerobic exercise weekly Get weight bearing exercise at least once weekly Bone density test:  A bone density test is an imaging test that uses a type of X-ray to measure the amount of calcium and other minerals in your bones. The test may be used to diagnose or screen you for a condition that causes weak or thin bones (osteoporosis), predict your risk for a broken bone (fracture), or determine how well your osteoporosis treatment is working. The bone density test is recommended for females 57 and older, or females or males <16 if certain risk factors such as thyroid disease, long term use of steroids such as for asthma or rheumatological issues, vitamin D deficiency, estrogen deficiency, family history of osteoporosis, self or family history of fragility fracture in first degree relative.  Osteoporosis per bone density 2022.   Been on Boniva since 2020.     Heart health: Get at least 150 minutes of aerobic exercise weekly Limit alcohol It is important to maintain a healthy blood pressure and healthy cholesterol numbers  Heart disease screening: Screening for heart disease includes screening for blood pressure, fasting lipids, glucose/diabetes screening, BMI height to weight ratio, reviewed of smoking status, physical activity, and diet.    Goals include blood pressure 120/80 or less, maintaining a healthy lipid/cholesterol profile, preventing diabetes or keeping diabetes numbers under good control, not smoking or using tobacco products, exercising most days per week or at least 150 minutes per week of exercise, and eating healthy variety of fruits and vegetables, healthy oils, and avoiding unhealthy food choices like fried food, fast food, high sugar and  high cholesterol foods.    Other tests may possibly include EKG test, CT coronary calcium score, echocardiogram, exercise treadmill stress test.   Discussed CT coronary test.  She will consider.   Medical care options: I recommend you continue to seek care here first for routine care.  We try really hard to have available appointments Monday through Friday daytime hours for sick visits, acute visits, and physicals.  Urgent care should be used for after hours and weekends for significant issues that cannot wait till the next day.  The emergency department should be used for significant potentially life-threatening emergencies.  The emergency department is expensive, can often have long wait times for less significant concerns, so try to utilize primary care, urgent care, or telemedicine when possible to avoid unnecessary trips to the emergency department.  Virtual visits and telemedicine have been introduced since the pandemic started in 2020, and can be convenient ways to receive medical care.  We offer virtual appointments as well to assist you in a variety of options to seek medical care.   Advanced Directives: I recommend you consider completing a [place] and Living Will.  These documents respect your wishes and help alleviate burdens on your loved ones if you were to become terminally ill or be in a position to need those documents enforced.    You can complete Advanced Directives yourself, have them notarized, then have copies made for our office, for you and for anybody you feel should have them in safe keeping.  Or, you can have an attorney prepare these documents.   If you haven't updated your Last Will and Testament in a while, it may be worthwhile having an attorney prepare these documents together and save on some costs.      Separate significant issues discussed: She seems to be doing well but prior labs in prior year years show normal T4 with abnormal TSH.   Updated labs today.  Briefly discussed thyroid disorders, under and over active.  Osteopososi - continue supplements, aerobic and weight bearing exercise, boniva.   She is on therapy per gynecology.   History of anemia - no current bleeding or bruising.  F/u pending labs  Hypertension - compliant with medication.   Discussed medicaiton, risks of uncontrolled hypertension, pros/cons of medication.   Leda was seen today for fasting cpe.  Diagnoses and all orders for this visit:  Encounter for health maintenance examination in adult -     CBC -     Comprehensive metabolic panel -     Lipid panel -     VITAMIN D 25 Hydroxy (Vit-D Deficiency, Fractures) -     TSH + free T4  Need for Tdap vaccination  Influenza vaccination declined  Osteoporosis, unspecified osteoporosis type, unspecified pathological fracture presence -     VITAMIN D 25 Hydroxy (Vit-D Deficiency, Fractures)  History of anemia  Screening for thyroid disorder -     TSH + free T4  Screening for lipid disorders -     Lipid panel  Abnormal thyroid blood test -     T3  Primary hypertension    Follow-up pending labs, yearly for physical

## 2022-05-09 ENCOUNTER — Encounter: Payer: Self-pay | Admitting: Internal Medicine

## 2022-05-10 ENCOUNTER — Other Ambulatory Visit: Payer: Self-pay | Admitting: Medical

## 2022-05-10 DIAGNOSIS — I10 Essential (primary) hypertension: Secondary | ICD-10-CM

## 2022-05-10 LAB — CBC
Hematocrit: 40.2 % (ref 34.0–46.6)
Hemoglobin: 13.5 g/dL (ref 11.1–15.9)
MCH: 30.7 pg (ref 26.6–33.0)
MCHC: 33.6 g/dL (ref 31.5–35.7)
MCV: 91 fL (ref 79–97)
Platelets: 241 10*3/uL (ref 150–450)
RBC: 4.4 x10E6/uL (ref 3.77–5.28)
RDW: 14.1 % (ref 11.7–15.4)
WBC: 4.2 10*3/uL (ref 3.4–10.8)

## 2022-05-10 LAB — COMPREHENSIVE METABOLIC PANEL
ALT: 11 IU/L (ref 0–32)
AST: 16 IU/L (ref 0–40)
Albumin/Globulin Ratio: 1.8 (ref 1.2–2.2)
Albumin: 4.8 g/dL (ref 3.9–4.9)
Alkaline Phosphatase: 74 IU/L (ref 44–121)
BUN/Creatinine Ratio: 16 (ref 12–28)
BUN: 13 mg/dL (ref 8–27)
Bilirubin Total: 0.3 mg/dL (ref 0.0–1.2)
CO2: 25 mmol/L (ref 20–29)
Calcium: 10.3 mg/dL (ref 8.7–10.3)
Chloride: 101 mmol/L (ref 96–106)
Creatinine, Ser: 0.82 mg/dL (ref 0.57–1.00)
Globulin, Total: 2.6 g/dL (ref 1.5–4.5)
Glucose: 78 mg/dL (ref 70–99)
Potassium: 4.5 mmol/L (ref 3.5–5.2)
Sodium: 138 mmol/L (ref 134–144)
Total Protein: 7.4 g/dL (ref 6.0–8.5)
eGFR: 81 mL/min/{1.73_m2} (ref >59)

## 2022-05-10 LAB — LIPID PANEL
Chol/HDL Ratio: 2.5 ratio (ref 0.0–4.4)
Cholesterol, Total: 190 mg/dL (ref 100–199)
HDL: 77 mg/dL (ref >39)
LDL Chol Calc (NIH): 104 mg/dL — ABNORMAL HIGH (ref 0–99)
Triglycerides: 46 mg/dL (ref 0–149)
VLDL Cholesterol Cal: 9 mg/dL (ref 5–40)

## 2022-05-10 LAB — TSH+FREE T4
Free T4: 1.27 ng/dL (ref 0.82–1.77)
TSH: 0.466 u[IU]/mL (ref 0.450–4.500)

## 2022-05-10 LAB — T3: T3, Total: 111 ng/dL (ref 71–180)

## 2022-05-10 LAB — VITAMIN D 25 HYDROXY (VIT D DEFICIENCY, FRACTURES): Vit D, 25-Hydroxy: 78 ng/mL (ref 30.0–100.0)

## 2022-05-10 MED ORDER — AMLODIPINE BESYLATE 5 MG PO TABS: 5.0000 mg | ORAL_TABLET | Freq: Every day | ORAL | 3 refills | Status: DC

## 2022-05-10 MED ORDER — LISINOPRIL-HYDROCHLOROTHIAZIDE 20-12.5 MG PO TABS: 1.0000 | ORAL_TABLET | Freq: Every day | ORAL | 3 refills | Status: DC

## 2022-05-26 ENCOUNTER — Other Ambulatory Visit (INDEPENDENT_AMBULATORY_CARE_PROVIDER_SITE_OTHER): Payer: BC Managed Care – PPO

## 2022-05-26 DIAGNOSIS — Z23 Encounter for immunization: Secondary | ICD-10-CM

## 2023-02-13 IMAGING — MR MR LUMBAR SPINE W/O CM
4 of 5 series · 18 of 48 positions shown · non-contrast
Comparison: Lumbar spine radiographs 12/17/06

CLINICAL DATA: Spinal stenosis, lumbosacral myelopathy. Low back
pain extending into the lower extremities bilaterally. Bilateral
foot leg numbness.

EXAM:
MRI LUMBAR SPINE WITHOUT CONTRAST
TECHNIQUE: Multiplanar, multisequence MR imaging of the lumbar spine was
performed. No intravenous contrast was administered.

[Series 5: T2 · sagittal · 4.0mm · 0.73mm/px · 6 of 15 slices shown (1 of 2)]
[im 1/15]
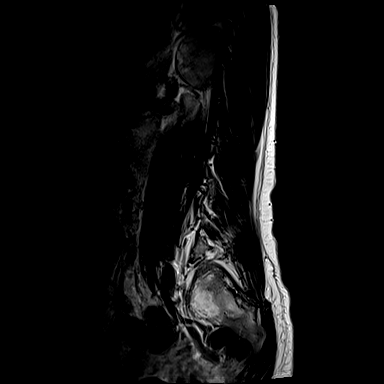
[im 3/15]
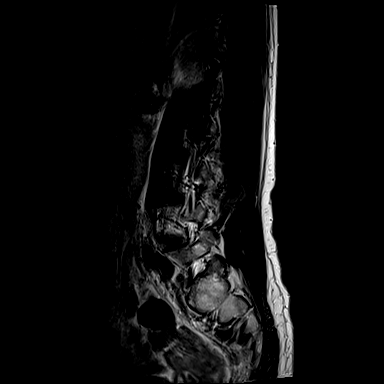
[im 6/15]
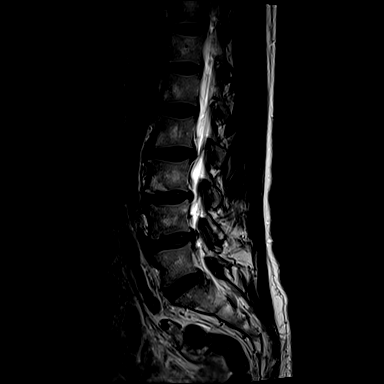
[im 9/15]
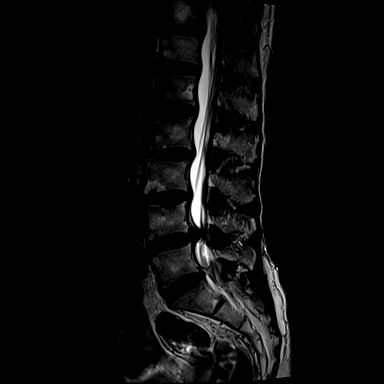
[im 12/15]
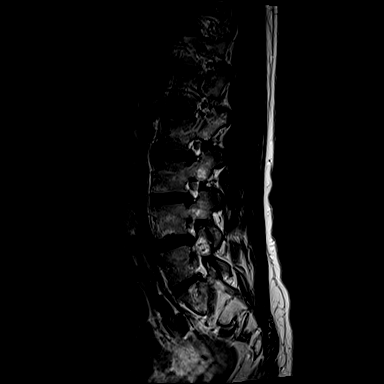
[im 15/15]
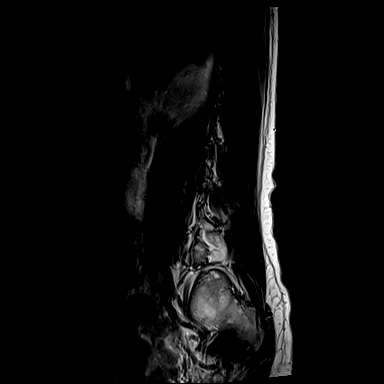

[Series 6: T1 · sagittal · 4.0mm · 0.73mm/px · 3 of 15 slices shown (1 of 2)]
[im 3/15]
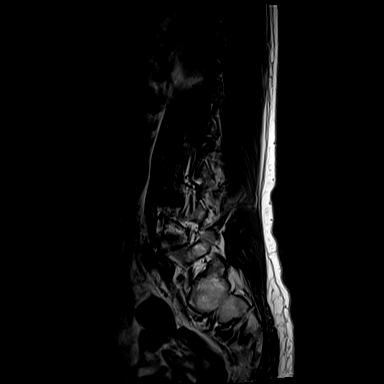
[im 9/15]
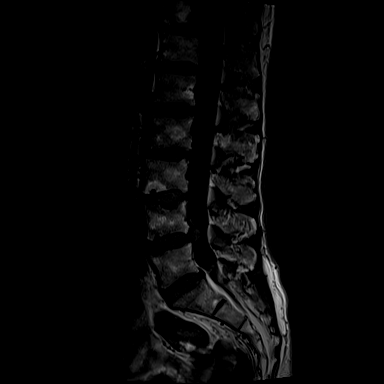
[im 15/15]
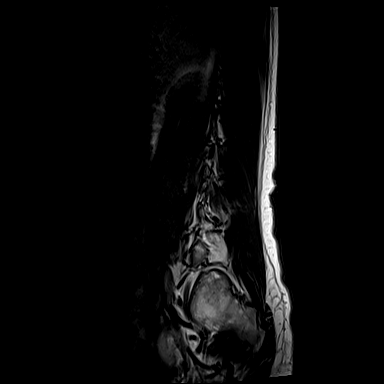

[Series 10: T1 · axial · 4.0mm · 0.28mm/px · z∈[+17,+168]mm · 3 of 39 slices shown (2 of 2)]
[im 6/39]
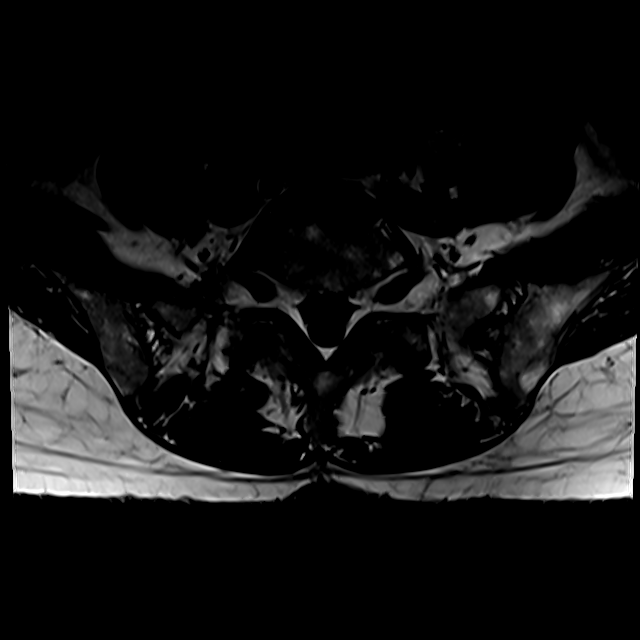
[im 20/39]
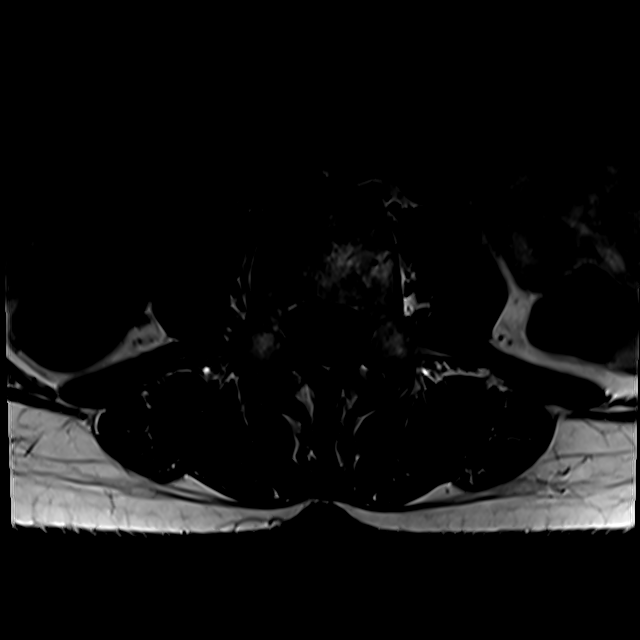
[im 33/39]
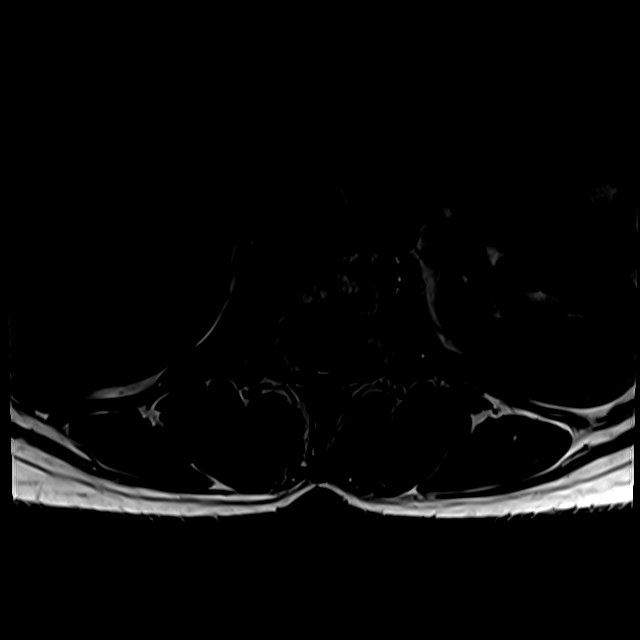

[Series 13: T2 · axial · 4.0mm · 0.28mm/px · z∈[-8,+168]mm · 6 of 39 slices shown (2 of 2)]
[im 1/39]
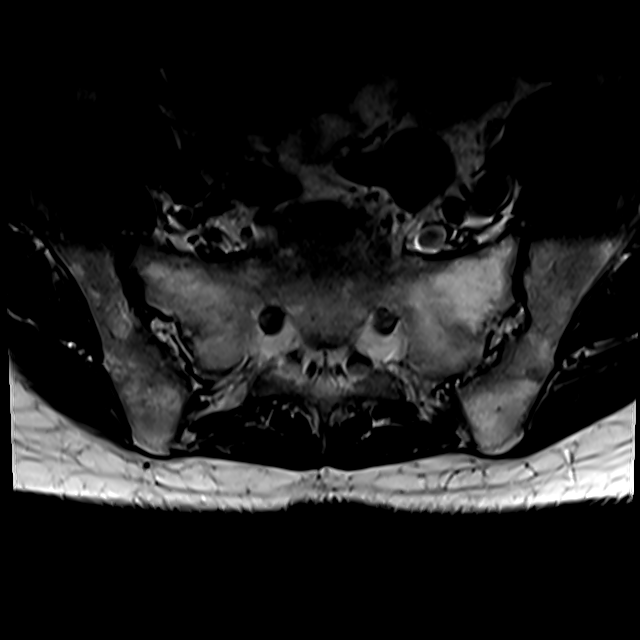
[im 6/39]
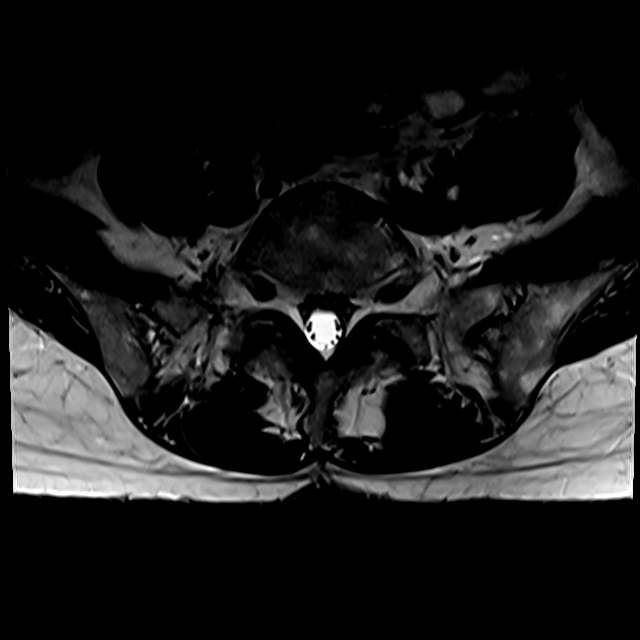
[im 11/39]
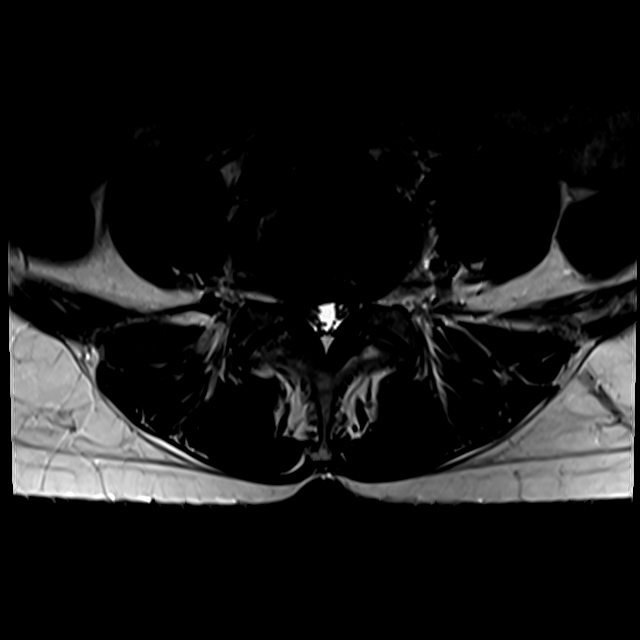
[im 17/39]
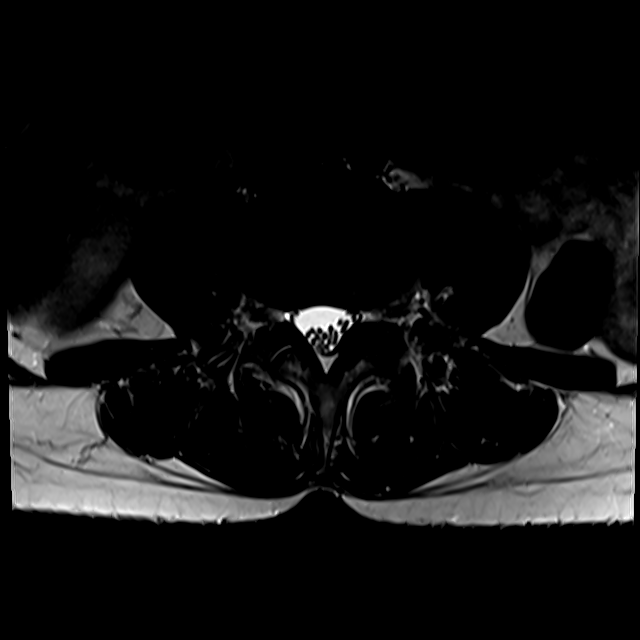
[im 20/39]
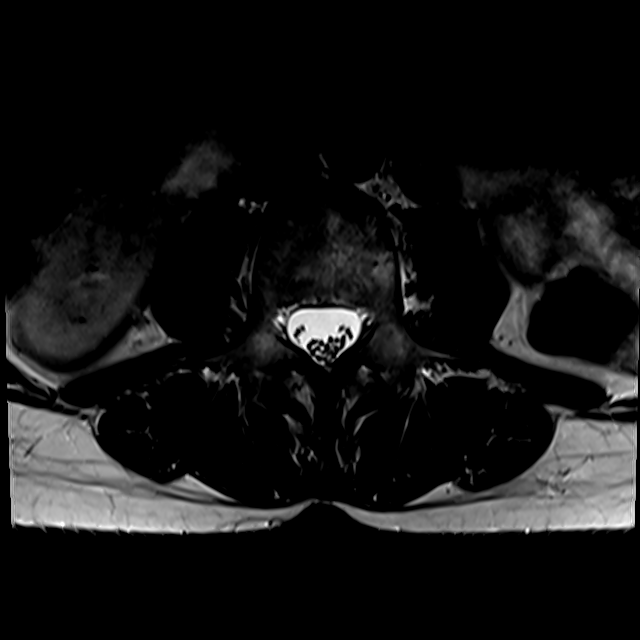
[im 33/39]
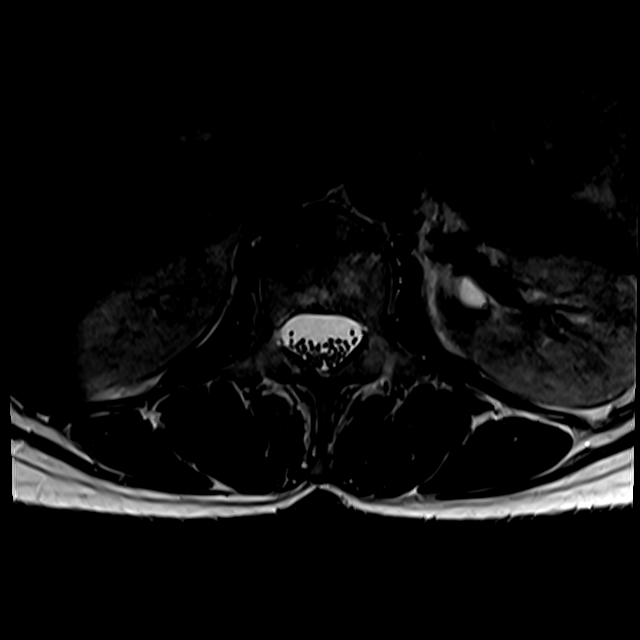

[18 of 48 positions shown; findings below may reference images not displayed]

FINDINGS: Segmentation: 5 non rib-bearing lumbar type vertebral bodies are
present. The lowest fully formed vertebral body is L5.

Alignment: No significant listhesis is present. Mild straightening
of the normal lumbar lordosis is noted.

Vertebrae: Fatty marrow changes surround an inferior endplate
Schmorl's noted anteriorly at L3. Other scattered fatty infiltration
marrow spaces within normal limits for age. Vertebral body heights
are maintained.

Conus medullaris and cauda equina: Conus extends to the L1 level.
Conus and cauda equina appear normal.

Paraspinal and other soft tissues: Limited imaging the abdomen is
unremarkable. There is no significant adenopathy. No solid organ
lesions are present.

Disc levels:

L1-2: Negative.

L2-3: Leftward disc protrusion extends into the foramen. Mild left
subarticular and foraminal narrowing is present. The right foramen
is patent.

L3-4: A mild broad-based disc protrusion is present. Disc material
extends into the foramina bilaterally. Mild left foraminal narrowing
is present.

L4-5: A broad-based disc protrusion is asymmetric to right. Moderate
subarticular narrowing is worse right left. Moderate bilateral
foraminal narrowing is present.

L5-S1: A shallow central disc protrusion is present. Central annular
tear is noted. No focal stenosis is present.
IMPRESSION: 1. Multilevel spondylosis of the lumbar spine as described.
2. No significant disease is at L4-5 where a rightward broad-based
disc protrusion results in moderate subarticular and foraminal
narrowing bilaterally, right greater than left.
3. Mild left subarticular and foraminal narrowing at L2-3.
4. Mild left foraminal narrowing at L3-4.
5. Shallow central disc protrusion and annular tear at L5-S1 without
significant stenosis.

## 2023-02-14 LAB — HM DEXA SCAN

## 2023-02-14 LAB — HM MAMMOGRAPHY

## 2023-05-09 ENCOUNTER — Other Ambulatory Visit: Payer: Self-pay | Admitting: Medical

## 2023-05-09 DIAGNOSIS — I10 Essential (primary) hypertension: Secondary | ICD-10-CM

## 2023-05-09 NOTE — Telephone Encounter (Signed)
Pt does not need a refill before her appt next week

## 2023-05-14 ENCOUNTER — Ambulatory Visit (INDEPENDENT_AMBULATORY_CARE_PROVIDER_SITE_OTHER): Payer: BC Managed Care – PPO | Admitting: Medical

## 2023-05-14 ENCOUNTER — Encounter: Payer: Self-pay | Admitting: Medical

## 2023-05-14 VITALS — BP 110/80 | HR 61 | Ht 64.0 in | Wt 124.0 lb

## 2023-05-14 DIAGNOSIS — Z1329 Encounter for screening for other suspected endocrine disorder: Secondary | ICD-10-CM

## 2023-05-14 DIAGNOSIS — Z23 Encounter for immunization: Secondary | ICD-10-CM

## 2023-05-14 DIAGNOSIS — I10 Essential (primary) hypertension: Secondary | ICD-10-CM

## 2023-05-14 DIAGNOSIS — Z0000 Encounter for general adult medical examination without abnormal findings: Secondary | ICD-10-CM | POA: Insufficient documentation

## 2023-05-14 DIAGNOSIS — M810 Age-related osteoporosis without current pathological fracture: Secondary | ICD-10-CM | POA: Diagnosis not present

## 2023-05-14 DIAGNOSIS — Z13220 Encounter for screening for lipoid disorders: Secondary | ICD-10-CM

## 2023-05-14 DIAGNOSIS — Z2821 Immunization not carried out because of patient refusal: Secondary | ICD-10-CM

## 2023-05-14 LAB — POCT URINALYSIS DIP (PROADVANTAGE DEVICE)
Bilirubin, UA: NEGATIVE
Blood, UA: NEGATIVE
Glucose, UA: NEGATIVE mg/dL
Ketones, POC UA: NEGATIVE mg/dL
Leukocytes, UA: NEGATIVE
Nitrite, UA: NEGATIVE
Protein Ur, POC: NEGATIVE mg/dL
Specific Gravity, Urine: 1.015
Urobilinogen, Ur: NEGATIVE
pH, UA: 7 (ref 5.0–8.0)

## 2023-05-14 NOTE — Progress Notes (Signed)
Subjective:   HPI  Dana Hays is a 62 y.o. female who presents for Chief Complaint  Patient presents with   Annual Exam    Fasting cpe, no concerns.     Patient Care Team: Marshay Slates, Cleda Mccreedy as PCP - General (Family Medicine) Glendale Chard, DO as Consulting Physician (Neurology) Sees dentist Sees eye doctor Dr. Richarda Overlie, gynecology Dr. Lisbeth Renshaw, neurosurgery Dr. Fredderick Severance, ortho   Concerns: Active, runs, bikes with group regularly, weight bearing exercise at the gym  Saw ortho recently regaring osteoporosis.  Been on boniva about 5 years.  Ortho wants to change her to [place] but in process of prior auth currently   Past Medical History:  Diagnosis Date   Anemia    prior to menopause   Arthritis    Colon cancer screening 10/2021   cologuard negative   Colon cancer screening 10/2021   negative cologuard   Hypertension    Osteoporosis 03/17/2019   Pterygium    bilat, causes redness, uses drops   Uncontrolled hypertension 09/04/2019    Family History  Problem Relation Age of Onset   Cancer Mother        ovarian   Hypertension Mother    Hypertension Father    Arthritis Father    Dementia Father 60   Bipolar disorder Sister    Heart disease Neg Hx    Stroke Neg Hx      Current Outpatient Medications:    amLODipine (NORVASC) 5 MG tablet, Take 1 tablet (5 mg total) by mouth daily., Disp: 90 tablet, Rfl: 3   Brimonidine Tartrate (LUMIFY) 0.025 % SOLN, Place 2 drops into both eyes 2 (two) times daily as needed (red eyes)., Disp: , Rfl:    Calcium Carb-Cholecalciferol (CALCIUM 600/VITAMIN D PO), Take 1 tablet by mouth daily., Disp: , Rfl:    ibandronate (BONIVA) 150 MG tablet, Take 150 mg by mouth every 30 (thirty) days. Take in the morning with a full glass of water, on an empty stomach, and do not take anything else by mouth or lie down for the next 30 min., Disp: , Rfl:    lisinopril-hydrochlorothiazide (ZESTORETIC) 20-12.5 MG tablet,  Take 1 tablet by mouth daily., Disp: 90 tablet, Rfl: 3   Multiple Vitamins-Minerals (MULTIVITAMIN WITH MINERALS) tablet, Take 1 tablet by mouth daily., Disp: , Rfl:   No Known Allergies   Reviewed their medical, surgical, family, social, medication, and allergy history and updated chart as appropriate.   Review of Systems  Constitutional:  Negative for chills, fever, malaise/fatigue and weight loss.  HENT:  Negative for congestion, ear pain, hearing loss, sore throat and tinnitus.   Eyes:  Negative for blurred vision, pain and redness.  Respiratory:  Negative for cough, hemoptysis and shortness of breath.   Cardiovascular:  Negative for chest pain, palpitations, orthopnea, claudication and leg swelling.  Gastrointestinal:  Negative for abdominal pain, blood in stool, constipation, diarrhea, nausea and vomiting.  Genitourinary:  Negative for dysuria, flank pain, frequency, hematuria and urgency.  Musculoskeletal:  Negative for falls, joint pain and myalgias.  Skin:  Negative for itching and rash.  Neurological:  Negative for dizziness, tingling, speech change, weakness and headaches.  Endo/Heme/Allergies:  Negative for polydipsia. Does not bruise/bleed easily.  Psychiatric/Behavioral:  Negative for depression and memory loss. The patient is not nervous/anxious and does not have insomnia.         05/14/2023   10:58 AM 05/08/2022   11:28 AM 05/05/2021  3:39 PM 03/10/2020    8:28 AM 09/04/2019   11:28 AM  Depression screen PHQ 2/9  Decreased Interest 0 0 0 0 0  Down, Depressed, Hopeless 0 0 0 0 0  PHQ - 2 Score 0 0 0 0 0       Objective:  BP 110/80   Pulse 61   Ht 5\' 4"  (1.626 m)   Wt 124 lb (56.2 kg)   BMI 21.28 kg/m   BP Readings from Last 3 Encounters:  05/14/23 110/80  05/08/22 120/72  09/21/21 116/71   Wt Readings from Last 3 Encounters:  05/14/23 124 lb (56.2 kg)  05/08/22 121 lb (54.9 kg)  09/16/21 119 lb 11.2 oz (54.3 kg)    General appearance: alert, no  distress, WD/WN, African American female Skin: unremarkable HEENT: normocephalic, conjunctiva/corneas normal, sclerae anicteric, PERRLA, EOMi, nares patent, no discharge or erythema, pharynx normal Oral cavity: MMM, tongue normal, teeth in good repair Neck: supple, no lymphadenopathy, no thyromegaly, no masses, normal ROM, no bruits Chest: non tender, normal shape and expansion Heart: RRR, normal S1, S2, no murmurs Lungs: CTA bilaterally, no wheezes, rhonchi, or rales Abdomen: +bs, soft, non tender, non distended, no masses, no hepatomegaly, no splenomegaly, no bruits Back: non tender, normal ROM, no scoliosis Musculoskeletal: upper extremities non tender, no obvious deformity, normal ROM throughout, lower extremities non tender, no obvious deformity, normal ROM throughout Extremities: no edema, no cyanosis, no clubbing Pulses: 2+ symmetric, upper and lower extremities, normal cap refill Neurological: alert, oriented x 3, CN2-12 intact, strength normal upper extremities and lower extremities, sensation normal throughout, DTRs 2+ throughout, no cerebellar signs, gait normal Psychiatric: normal affect, behavior normal, pleasant  Breast/gyn/rectal - deferred to gynecology      Assessment and Plan :   Encounter Diagnoses  Name Primary?   Routine general medical examination at a health care facility Yes   COVID-19 vaccine administered    Primary hypertension    Age-related osteoporosis without current pathological fracture    Screening for thyroid disorder    Screening for lipid disorders    Influenza vaccination declined      This visit was a preventative care visit, also known as wellness visit or routine physical.   Topics typically include healthy lifestyle, diet, exercise, preventative care, vaccinations, sick and well care, proper use of emergency dept and after hours care, as well as other concerns.     Recommendations: Continue to return yearly for your annual wellness and  preventative care visits.  This gives [place] a chance to discuss healthy lifestyle, exercise, vaccinations, review your chart record, and perform screenings where appropriate.  I recommend you see your eye doctor yearly for routine vision care.  I recommend you see your dentist yearly for routine dental care including hygiene visits twice yearly.   Vaccination recommendations were reviewed Immunization History  Administered Date(s) Administered   PFIZER(Purple Top)SARS-COV-2 Vaccination 09/25/2019, 10/21/2019, 05/11/2020   Pfizer Covid-19 Vaccine Bivalent Booster 23yrs & up 04/13/2021   Pfizer(Comirnaty)Fall Seasonal Vaccine 12 years and older 05/26/2022, 05/14/2023   Tdap 05/08/2022   Zoster Recombinant(Shingrix) 05/05/2021, 11/07/2021    You decline flu shot today  Counseled on the Covid virus vaccine.  Vaccine information sheet given.  Covid vaccine given after consent  obtained.   Screening for cancer: Colon cancer screening: Cologuard negative 10/2021  Breast cancer screening: You should perform a self breast exam monthly.   We reviewed recommendations for regular mammograms and breast cancer screening.  Cervical cancer screening: We  reviewed recommendations for pap smear screening.   Skin cancer screening: Check your skin regularly for new changes, growing lesions, or other lesions of concern Come in for evaluation if you have skin lesions of concern.  Lung cancer screening: If you have a greater than 20 pack year history of tobacco use, then you may qualify for lung cancer screening with a chest CT scan.   Please call your insurance company to inquire about coverage for this test.  We currently don't have screenings for other cancers besides breast, cervical, colon, and lung cancers.  If you have a strong family history of cancer or have other cancer screening concerns, please let me know.     Bone health: Get at least 150 minutes of aerobic exercise weekly Get weight  bearing exercise at least once weekly Bone density test:  A bone density test is an imaging test that uses a type of X-ray to measure the amount of calcium and other minerals in your bones. The test may be used to diagnose or screen you for a condition that causes weak or thin bones (osteoporosis), predict your risk for a broken bone (fracture), or determine how well your osteoporosis treatment is working. The bone density test is recommended for females 65 and older, or females or males <65 if certain risk factors such as thyroid disease, long term use of steroids such as for asthma or rheumatological issues, vitamin D deficiency, estrogen deficiency, family history of osteoporosis, self or family history of fragility fracture in first degree relative.  Osteoporosis per bone density 2022.   Been on Boniva since 2020.  Currently in process with prior auth for [employer] per ortho   Heart health: Get at least 150 minutes of aerobic exercise weekly Limit alcohol It is important to maintain a healthy blood pressure and healthy cholesterol numbers  Heart disease screening: Screening for heart disease includes screening for blood pressure, fasting lipids, glucose/diabetes screening, BMI height to weight ratio, reviewed of smoking status, physical activity, and diet.    Goals include blood pressure 120/80 or less, maintaining a healthy lipid/cholesterol profile, preventing diabetes or keeping diabetes numbers under good control, not smoking or using tobacco products, exercising most days per week or at least 150 minutes per week of exercise, and eating healthy variety of fruits and vegetables, healthy oils, and avoiding unhealthy food choices like fried food, fast food, high sugar and high cholesterol foods.    Other tests may possibly include EKG test, CT coronary calcium score, echocardiogram, exercise treadmill stress test.    Medical care options: I recommend you continue to seek care here first  for routine care.  We try really hard to have available appointments Monday through Friday daytime hours for sick visits, acute visits, and physicals.  Urgent care should be used for after hours and weekends for significant issues that cannot wait till the next day.  The emergency department should be used for significant potentially life-threatening emergencies.  The emergency department is expensive, can often have long wait times for less significant concerns, so try to utilize primary care, urgent care, or telemedicine when possible to avoid unnecessary trips to the emergency department.  Virtual visits and telemedicine have been introduced since the pandemic started in 2020, and can be convenient ways to receive medical care.  We offer virtual appointments as well to assist you in a variety of options to seek medical care.   Advanced Directives: I recommend you consider completing a Health Care Power of [address]  and Living Will.   These documents respect your wishes and help alleviate burdens on your loved ones if you were to become terminally ill or be in a position to need those documents enforced.    You can complete Advanced Directives yourself, have them notarized, then have copies made for our office, for you and for anybody you feel should have them in safe keeping.  Or, you can have an attorney prepare these documents.   If you haven't updated your Last Will and Testament in a while, it may be worthwhile having an attorney prepare these documents together and save on some costs.      Separate significant issues discussed: Hypertension - compliant with medication.      Dana Hays was seen today for annual exam.  Diagnoses and all orders for this visit:  Routine general medical examination at a health care facility -     POCT Urinalysis DIP (Proadvantage Device) -     Lipid panel -     CBC -     TSH + free T4  COVID-19 vaccine administered -     Pfizer Comirnaty Covid -19 Vaccine  67yrs and older  Primary hypertension  Age-related osteoporosis without current pathological fracture  Screening for thyroid disorder -     TSH + free T4  Screening for lipid disorders -     Lipid panel  Influenza vaccination declined    Follow-up pending labs, yearly for physical

## 2023-05-15 ENCOUNTER — Other Ambulatory Visit: Payer: Self-pay | Admitting: Medical

## 2023-05-15 DIAGNOSIS — I10 Essential (primary) hypertension: Secondary | ICD-10-CM

## 2023-05-15 LAB — LIPID PANEL
Cholesterol, Total: 188 mg/dL (ref 100–199)
HDL: 71 mg/dL (ref >39)
LDL CALC COMMENT:: 2.6 ratio (ref 0.0–4.4)
LDL Chol Calc (NIH): 106 mg/dL — ABNORMAL HIGH (ref 0–99)
Triglycerides: 56 mg/dL (ref 0–149)
VLDL Cholesterol Cal: 11 mg/dL (ref 5–40)

## 2023-05-15 LAB — CBC
Hematocrit: 38.5 % (ref 34.0–46.6)
Hemoglobin: 13 g/dL (ref 11.1–15.9)
MCH: 30.7 pg (ref 26.6–33.0)
MCHC: 33.8 g/dL (ref 31.5–35.7)
MCV: 91 fL (ref 79–97)
Platelets: 214 10*3/uL (ref 150–450)
RBC: 4.23 x10E6/uL (ref 3.77–5.28)
RDW: 12.9 % (ref 11.7–15.4)
WBC: 4.1 10*3/uL (ref 3.4–10.8)

## 2023-05-15 LAB — TSH+FREE T4
Free T4: 1.19 ng/dL (ref 0.82–1.77)
TSH: 0.737 u[IU]/mL (ref 0.450–4.500)

## 2023-05-15 MED ORDER — AMLODIPINE BESYLATE 5 MG PO TABS
5.0000 mg | ORAL_TABLET | Freq: Every day | ORAL | 3 refills | Status: DC
Start: 2023-05-15 — End: 2024-05-20

## 2023-05-15 MED ORDER — LISINOPRIL-HYDROCHLOROTHIAZIDE 20-12.5 MG PO TABS: 1.0000 | ORAL_TABLET | Freq: Every day | ORAL | 3 refills | Status: DC

## 2023-05-15 NOTE — Progress Notes (Signed)
Results sent through MyChart

## 2023-05-16 ENCOUNTER — Encounter: Payer: Self-pay | Admitting: Internal Medicine

## 2023-12-26 ENCOUNTER — Ambulatory Visit
Admission: EM | Admit: 2023-12-26 | Discharge: 2023-12-26 | Disposition: A | Discharge status: Home / Self Care | Attending: Family Medicine | Admitting: Family Medicine

## 2023-12-26 ENCOUNTER — Ambulatory Visit: Payer: Self-pay

## 2023-12-26 DIAGNOSIS — S61216A Laceration without foreign body of right little finger without damage to nail, initial encounter: Secondary | ICD-10-CM | POA: Diagnosis not present

## 2023-12-26 DIAGNOSIS — M79644 Pain in right finger(s): Secondary | ICD-10-CM

## 2023-12-26 MED ORDER — BACITRACIN ZINC 500 UNIT/GM EX OINT
1.0000 | TOPICAL_OINTMENT | Freq: Two times a day (BID) | CUTANEOUS | 0 refills | Status: DC
Start: 2023-12-26 — End: 2024-05-19

## 2023-12-26 NOTE — ED Triage Notes (Signed)
 Pt states that she has a laceration to her right pinky finger. X1 day

## 2023-12-26 NOTE — ED Provider Notes (Signed)
 [facility]  Note:  This document was prepared using Conservation officer, historic buildings and may include unintentional dictation errors.  MRN: [id number] DOB: 1961/04/23  Subjective:   Dana Hays is a 63 y.o. female presenting for suffering a right fifth finger laceration today.  Patient was reaching into a shelf and her ring got caught on the shelf causing a tear along the right pinky finger.  Patient cleansed the wound, covered it and came into the clinic.  Tdap is up-to-date.  No current facility-administered medications for this encounter.  Current Outpatient Medications:    amLODipine  (NORVASC ) 5 MG tablet, Take 1 tablet (5 mg total) by mouth daily., Disp: 90 tablet, Rfl: 3   Calcium Carb-Cholecalciferol (CALCIUM 600/VITAMIN D  PO), Take 1 tablet by mouth daily., Disp: , Rfl:    lisinopril -hydrochlorothiazide  (ZESTORETIC ) 20-12.5 MG tablet, Take 1 tablet by mouth daily., Disp: 90 tablet, Rfl: 3   Multiple Vitamins-Minerals (MULTIVITAMIN WITH MINERALS) tablet, Take 1 tablet by mouth daily., Disp: , Rfl:    Brimonidine  Tartrate (LUMIFY ) 0.025 % SOLN, Place 2 drops into both eyes 2 (two) times daily as needed (red eyes)., Disp: , Rfl:    ibandronate (BONIVA) 150 MG tablet, Take 150 mg by mouth every 30 (thirty) days. Take in the morning with a full glass of water, on an empty stomach, and do not take anything else by mouth or lie down for the next 30 min., Disp: , Rfl:    No Known Allergies  Past Medical History:  Diagnosis Date   Anemia    prior to menopause   Arthritis    Colon cancer screening 10/2021   cologuard negative   Colon cancer screening 10/2021   negative cologuard   Hypertension    Osteoporosis 03/17/2019   Pterygium    bilat, causes redness, uses drops   Uncontrolled hypertension 09/04/2019     Past Surgical History:  Procedure Laterality Date   APPENDECTOMY     COLONOSCOPY  2012   LAMINECTOMY N/A 09/20/2021   Procedure:  Tthoracic three- Thoracic four LAMINECTOMY, RESECTION OF INTRADURAL EXTRAMEDULLARY TUMOR;  Surgeon: Augusto Blonder, MD;  Location: [facility] OR;  Service: Neurosurgery;  Laterality: N/A;   MYOMECTOMY     OPERATIVE ULTRASOUND N/A 09/20/2021   Procedure: OPERATIVE ULTRASOUND;  Surgeon: Augusto Blonder, MD;  Location: [facility] OR;  Service: Neurosurgery;  Laterality: N/A;    Family History  Problem Relation Age of Onset   Cancer Mother        ovarian   Hypertension Mother    Hypertension Father    Arthritis Father    Dementia Father 33   Bipolar disorder Sister    Heart disease Neg Hx    Stroke Neg Hx     Social History   Tobacco Use   Smoking status: Never   Smokeless tobacco: Never  Vaping Use   Vaping status: Never Used  Substance Use Topics   Alcohol use: Never   Drug use: Never    ROS   Objective:   Vitals: BP 116/75 (BP Location: Right Arm)   Pulse 71   Temp 98.8 F (37.1 C) (Oral)   Resp 18   Ht 5\' 4"  (1.626 m)   Wt 127 lb (57.6 kg)   SpO2 98%   BMI 21.80 kg/m   Physical Exam Constitutional:      General: She is not in acute distress.    Appearance: Normal appearance. She is well-developed. She is not ill-appearing, toxic-appearing or diaphoretic.  HENT:     Head: Normocephalic and atraumatic.     Nose: Nose normal.     Mouth/Throat:     Mouth: Mucous membranes are moist.  Eyes:     General: No scleral icterus.       Right eye: No discharge.        Left eye: No discharge.     Extraocular Movements: Extraocular movements intact.  Cardiovascular:     Rate and Rhythm: Normal rate.  Pulmonary:     Effort: Pulmonary effort is normal.  Musculoskeletal:       Hands:  Skin:    General: Skin is warm and dry.  Neurological:     General: No focal deficit present.     Mental Status: She is alert and oriented to person, place, and time.  Psychiatric:        Mood and Affect: Mood normal.        Behavior: Behavior normal.    Wound cleansed,  dressed.  Assessment and Plan :   PDMP not reviewed this encounter.  1. Finger pain, right   2. Laceration of right little finger without foreign body without damage to nail, initial encounter    Laceration not amenable to repair with sutures.  Wound should heal by secondary intention.  Wound care reviewed.  Counseled patient on potential for adverse effects with medications prescribed/recommended today, ER and return-to-clinic precautions discussed, patient verbalized understanding.    Adolph Hoop, PA-C 12/26/23 1329

## 2023-12-26 NOTE — Discharge Instructions (Signed)
 Change your dressing 3-5 times daily depending on how much you use your right hand, may need more if your dressing gets dirty. Every time you change your dressing, clean the wound gently with warm water and Dial antibacterial soap. Pat the wound dry, let it breathe for roughly an hour before covering it back up. When you reapply a dressing, apply Bacitracin  ointment to the wound, then cover with non-stick/non-adherent gauze and secure with Coban.  After ~5-7 days, the wound should scab over nicely and then you don't have to continue doing dressings.  You may need to do this up to 2 weeks however.

## 2023-12-26 NOTE — Telephone Encounter (Signed)
 There is openings today

## 2023-12-26 NOTE — Telephone Encounter (Signed)
 Called pt and offered appt today and she stated she would have loved to but call center sent her to Urgent Care. I apologized to patient and advised her in the future to have call center check with the office while she was on the phone, we would have brought her into the office today

## 2023-12-26 NOTE — Telephone Encounter (Signed)
  Chief Complaint: finger laceration Symptoms: right pinky finger laceration about 1/2 inch Frequency: since 0600 this morning Pertinent Negatives: Patient denies spurting or gushing blood Disposition: [] ED /[x] Urgent Care (no appt availability in office) / [] Appointment(In office/virtual)/ []  [facility]/ [] Home Care/ [] Refused Recommended Disposition /[] [facility]/ []  Follow-up with PCP Additional Notes: Patient states she cut her finger on a metal piece this morning around 0600 this morning. She states it is still bleeding, patient states she has not held pressure but did apply a band aid. Advised patient to hold direct pressure for 10 minutes. No available appts with PCP, advised patient to go to urgent care.  Copied from CRM [id number]. Topic: Clinical - Red Word Triage >> Dec 26, 2023 12:19 PM [employer] wrote: Red Word that prompted transfer to Nurse Triage: Patient states metal piece on step ladder cut her right pinky finger, causing it to bleed. It is also swollen Answer Assessment - Initial Assessment Questions 1. APPEARANCE of INJURY: "What does the injury look like?"      Swollen, bleeding.  2. SIZE: "How large is the cut?"      Diagonal laceration about 1/2 inch long.  3. BLEEDING: "Is it bleeding now?" If Yes, ask: "Is it difficult to stop?"      Yes. She states she wrapped it in gauze and a band aid and it continued to bleed. She states that it is still bleeding. She states she has not been holding any pressure.  4. LOCATION: "Where is the injury located?"      Right pinky finger top/palm side  5. ONSET: "How long ago did the injury occur?"      This morning around 0600.  6. MECHANISM: "Tell me how it happened."      Metal piece, in wall that holds clothes rod.She states she was stepping down and her ring caught the metal piece and cut her finger.  7. TETANUS: "When was the last tetanus booster?"     2023.  8. PREGNANCY: "Is there any chance you  are pregnant?" "When was your last menstrual period?"     N/A.  Protocols used: Cuts and Lacerations-A-AH

## 2024-05-16 ENCOUNTER — Other Ambulatory Visit: Payer: Self-pay | Admitting: Medical

## 2024-05-16 DIAGNOSIS — I10 Essential (primary) hypertension: Secondary | ICD-10-CM

## 2024-05-19 ENCOUNTER — Ambulatory Visit: Payer: BC Managed Care – PPO | Admitting: Medical

## 2024-05-19 ENCOUNTER — Encounter: Payer: Self-pay | Admitting: Medical

## 2024-05-19 VITALS — BP 122/80 | HR 75 | Ht 64.0 in | Wt 128.4 lb

## 2024-05-19 DIAGNOSIS — Z23 Encounter for immunization: Secondary | ICD-10-CM | POA: Diagnosis not present

## 2024-05-19 DIAGNOSIS — M810 Age-related osteoporosis without current pathological fracture: Secondary | ICD-10-CM | POA: Diagnosis not present

## 2024-05-19 DIAGNOSIS — Z1389 Encounter for screening for other disorder: Secondary | ICD-10-CM

## 2024-05-19 DIAGNOSIS — Z7185 Encounter for immunization safety counseling: Secondary | ICD-10-CM

## 2024-05-19 DIAGNOSIS — I10 Essential (primary) hypertension: Secondary | ICD-10-CM

## 2024-05-19 DIAGNOSIS — Z0000 Encounter for general adult medical examination without abnormal findings: Secondary | ICD-10-CM

## 2024-05-19 DIAGNOSIS — M47816 Spondylosis without myelopathy or radiculopathy, lumbar region: Secondary | ICD-10-CM | POA: Diagnosis not present

## 2024-05-19 DIAGNOSIS — Z13220 Encounter for screening for lipoid disorders: Secondary | ICD-10-CM

## 2024-05-19 LAB — MICROSCOPIC EXAMINATION

## 2024-05-19 NOTE — Progress Notes (Signed)
 Subjective:   HPI  Dana Hays is a 63 y.o. female who presents for Chief Complaint  Patient presents with   Annual Exam    Fasting cpe, no concerns. Declines flu and pneumonia today will get covid shot    Patient Care Team: Daniele Dillow, Alm RAMAN, PA-C as PCP - General (Family Medicine) Patel, Donika K, DO as Consulting Physician (Neurology) Sees dentist Sees eye doctor Dr. Charlie Croak, gynecology Dr. Gerldine Maizes, neurosurgery Dr. Norleen Crease, ortho   Concerns: Dana Hays is a 63 year old female who presents for an annual physical exam.  She feels well and reports no new symptoms.   Her past medical history includes osteoporosis, hypertension, and arthritis. She has undergone appendectomy, back surgery, and fibroid surgery. She had a colonoscopy in 2012 and more recent screenings, including a Cologuard test in April 2023. She is up to date with her Pap smears and has a mammogram scheduled for December 1st. Her last bone density scan was on July 17th, 2024  HTN - compliant with amlodipine  5 mg daily, lisinopril   HCT 20/12.5 mg daily  Osteoporosis - on Boniva fo likely > 5 years.   Went to specialist last year as was recommended [facility] but this was cost prohibitive.  She does not smoke, consume alcohol, or use drugs. She lives with her husband and engages in physical activities such as biking, walking, and running. She occasionally does weight-bearing exercises but not as frequently as she would like.     Past Medical History:  Diagnosis Date   Anemia    prior to menopause   Arthritis    Colon cancer screening 10/2021   cologuard negative   Colon cancer screening 10/2021   negative cologuard   Encounter for colorectal cancer screening using Cologuard test 10/2021   negative   Hypertension    Osteoporosis 03/17/2019   Pterygium    bilat, causes redness, uses drops   Uncontrolled hypertension 09/04/2019    Family History  Problem Relation Age of  Onset   Cancer Mother        ovarian   Hypertension Mother    Hypertension Father    Arthritis Father    Dementia Father 53   Bipolar disorder Sister    Heart disease Neg Hx    Stroke Neg Hx      Current Outpatient Medications:    amLODipine  (NORVASC ) 5 MG tablet, Take 1 tablet (5 mg total) by mouth daily., Disp: 90 tablet, Rfl: 3   Brimonidine  Tartrate (LUMIFY ) 0.025 % SOLN, Place 2 drops into both eyes 2 (two) times daily as needed (red eyes)., Disp: , Rfl:    Calcium Carb-Cholecalciferol (CALCIUM 600/VITAMIN D  PO), Take 1 tablet by mouth daily., Disp: , Rfl:    ibandronate (BONIVA) 150 MG tablet, Take 150 mg by mouth every 30 (thirty) days. Take in the morning with a full glass of water, on an empty stomach, and do not take anything else by mouth or lie down for the next 30 min., Disp: , Rfl:    lisinopril -hydrochlorothiazide  (ZESTORETIC ) 20-12.5 MG tablet, Take 1 tablet by mouth once daily, Disp: 90 tablet, Rfl: 0   Multiple Vitamins-Minerals (MULTIVITAMIN WITH MINERALS) tablet, Take 1 tablet by mouth daily., Disp: , Rfl:   No Known Allergies   Reviewed their medical, surgical, family, social, medication, and allergy history and updated chart as appropriate.   Review of Systems  Constitutional:  Negative for chills, fever, malaise/fatigue and weight loss.  HENT:  Negative  for congestion, ear pain, hearing loss, sore throat and tinnitus.   Eyes:  Negative for blurred vision, pain and redness.  Respiratory:  Negative for cough, hemoptysis and shortness of breath.   Cardiovascular:  Negative for chest pain, palpitations, orthopnea, claudication and leg swelling.  Gastrointestinal:  Negative for abdominal pain, blood in stool, constipation, diarrhea, nausea and vomiting.  Genitourinary:  Negative for dysuria, flank pain, frequency, hematuria and urgency.  Musculoskeletal:  Negative for falls, joint pain and myalgias.  Skin:  Negative for itching and rash.  Neurological:  Negative  for dizziness, tingling, speech change, weakness and headaches.  Endo/Heme/Allergies:  Negative for polydipsia. Does not bruise/bleed easily.  Psychiatric/Behavioral:  Negative for depression and memory loss. The patient is not nervous/anxious and does not have insomnia.         05/19/2024    9:32 AM 05/14/2023   10:58 AM 05/08/2022   11:28 AM 05/05/2021    3:39 PM 03/10/2020    8:28 AM  Depression screen PHQ 2/9  Decreased Interest 0 0 0 0 0  Down, Depressed, Hopeless 0 0 0 0 0  PHQ - 2 Score 0 0 0 0 0       Objective:  BP 122/80   Pulse 75   Ht 5' 4 (1.626 m)   Wt 128 lb 6.4 oz (58.2 kg)   BMI 22.04 kg/m   BP Readings from Last 3 Encounters:  05/19/24 122/80  12/26/23 116/75  05/14/23 110/80   Wt Readings from Last 3 Encounters:  05/19/24 128 lb 6.4 oz (58.2 kg)  12/26/23 127 lb (57.6 kg)  05/14/23 124 lb (56.2 kg)    General appearance: alert, no distress, WD/WN, African American female Skin: unremarkable HEENT: normocephalic, conjunctiva/corneas normal, sclerae anicteric, PERRLA, EOMi, nares patent, no discharge or erythema, pharynx normal Oral cavity: MMM, tongue normal, teeth in good repair Neck: supple, no lymphadenopathy, no thyromegaly, no masses, normal ROM, no bruits Chest: non tender, normal shape and expansion Heart: RRR, normal S1, S2, no murmurs Lungs: CTA bilaterally, no wheezes, rhonchi, or rales Abdomen: +bs, soft, non tender, non distended, no masses, no hepatomegaly, no splenomegaly, no bruits Back: non tender, normal ROM, no scoliosis Musculoskeletal: upper extremities non tender, no obvious deformity, normal ROM throughout, lower extremities non tender, no obvious deformity, normal ROM throughout Extremities: no edema, no cyanosis, no clubbing Pulses: 2+ symmetric, upper and lower extremities, normal cap refill Neurological: alert, oriented x 3, CN2-12 intact, strength normal upper extremities and lower extremities, sensation normal throughout,  DTRs 2+ throughout, no cerebellar signs, gait normal Psychiatric: normal affect, behavior normal, pleasant  Breast/gyn/rectal - deferred to gynecology      Assessment and Plan :   Encounter Diagnoses  Name Primary?   Encounter for health maintenance examination in adult Yes   COVID-19 vaccine administered    Arthritis, lumbar spine    Age-related osteoporosis without current pathological fracture    Primary hypertension    Vaccine counseling    Screening for hematuria or proteinuria    Screening for lipid disorders      This visit was a preventative care visit, also known as wellness visit or routine physical.   Topics typically include healthy lifestyle, diet, exercise, preventative care, vaccinations, sick and well care, proper use of emergency dept and after hours care, as well as other concerns.     Recommendations: Continue to return yearly for your annual wellness and preventative care visits.  This gives us  a chance to discuss healthy lifestyle,  exercise, vaccinations, review your chart record, and perform screenings where appropriate.  I recommend you see your eye doctor yearly for routine vision care.  I recommend you see your dentist yearly for routine dental care including hygiene visits twice yearly.   Vaccination recommendations were reviewed Immunization History  Administered Date(s) Administered   PFIZER(Purple Top)SARS-COV-2 Vaccination 09/25/2019, 10/21/2019, 05/11/2020   Pfizer Covid-19 Vaccine Bivalent Booster 42yrs & up 04/13/2021   Pfizer(Comirnaty)Fall Seasonal Vaccine 12 years and older 05/26/2022, 05/14/2023, 05/19/2024   Tdap 05/08/2022   Zoster Recombinant(Shingrix) 05/05/2021, 11/07/2021    You decline flu shot today  Counseled on the Covid virus vaccine.  Covid vaccine given after consent  obtained.   Screening for cancer: Colon cancer screening: Cologuard negative 10/2021  Breast cancer screening: You should perform a self breast exam  monthly.   We reviewed recommendations for regular mammograms and breast cancer screening.  Cervical cancer screening: We reviewed recommendations for pap smear screening.   Skin cancer screening: Check your skin regularly for new changes, growing lesions, or other lesions of concern Come in for evaluation if you have skin lesions of concern.  Lung cancer screening: If you have a greater than 20 pack year history of tobacco use, then you may qualify for lung cancer screening with a chest CT scan.   Please call your insurance company to inquire about coverage for this test.  We currently don't have screenings for other cancers besides breast, cervical, colon, and lung cancers.  If you have a strong family history of cancer or have other cancer screening concerns, please let me know.     Bone health: Get at least 150 minutes of aerobic exercise weekly Get weight bearing exercise at least once weekly Bone density test:  A bone density test is an imaging test that uses a type of X-ray to measure the amount of calcium and other minerals in your bones. The test may be used to diagnose or screen you for a condition that causes weak or thin bones (osteoporosis), predict your risk for a broken bone (fracture), or determine how well your osteoporosis treatment is working. The bone density test is recommended for females 65 and older, or females or males <65 if certain risk factors such as thyroid disease, long term use of steroids such as for asthma or rheumatological issues, vitamin D  deficiency, estrogen deficiency, family history of osteoporosis, self or family history of fragility fracture in first degree relative.  Osteoporosis per bone density 2024.  Advised follow up with osteoporosis clinic at [facility] regarding options for therapy.   Heart health: Get at least 150 minutes of aerobic exercise weekly Limit alcohol It is important to maintain a healthy blood pressure and healthy cholesterol  numbers  Heart disease screening: Screening for heart disease includes screening for blood pressure, fasting lipids, glucose/diabetes screening, BMI height to weight ratio, reviewed of smoking status, physical activity, and diet.    Goals include blood pressure 120/80 or less, maintaining a healthy lipid/cholesterol profile, preventing diabetes or keeping diabetes numbers under good control, not smoking or using tobacco products, exercising most days per week or at least 150 minutes per week of exercise, and eating healthy variety of fruits and vegetables, healthy oils, and avoiding unhealthy food choices like fried food, fast food, high sugar and high cholesterol foods.    Other tests may possibly include EKG test, CT coronary calcium score, echocardiogram, exercise treadmill stress test.    Medical care options: I recommend you continue to seek care  here first for routine care.  We try really hard to have available appointments Monday through Friday daytime hours for sick visits, acute visits, and physicals.  Urgent care should be used for after hours and weekends for significant issues that cannot wait till the next day.  The emergency department should be used for significant potentially life-threatening emergencies.  The emergency department is expensive, can often have long wait times for less significant concerns, so try to utilize primary care, urgent care, or telemedicine when possible to avoid unnecessary trips to the emergency department.  Virtual visits and telemedicine have been introduced since the pandemic started in 2020, and can be convenient ways to receive medical care.  We offer virtual appointments as well to assist you in a variety of options to seek medical care.   Advanced Directives: I recommend you consider completing a Health Care Power of Attorney and Living Will.   These documents respect your wishes and help alleviate burdens on your loved ones if you were to become  terminally ill or be in a position to need those documents enforced.    You can complete Advanced Directives yourself, have them notarized, then have copies made for our office, for you and for anybody you feel should have them in safe keeping.  Or, you can have an attorney prepare these documents.   If you haven't updated your Last Will and Testament in a while, it may be worthwhile having an attorney prepare these documents together and save on some costs.      Separate significant issues discussed: Hypertension -continue amlodipine  5 mg daily, continue lisinopril  HCT 20/12.5 mg daily  Osteoporosis-follow-up with osteoporosis clinic for other therapy options.  Advise she take a drug holiday on Boniva since she thinks she has been on it for 5+ years at this point   [place] was seen today for annual exam.  Diagnoses and all orders for this visit:  Encounter for health maintenance examination in adult -     CBC -     Comprehensive metabolic panel with GFR -     Lipid panel -     Urinalysis, Routine w reflex microscopic -     TSH  COVID-19 vaccine administered -     Pfizer Comirnaty Covid-19 Vaccine 3yrs & older  Arthritis, lumbar spine  Age-related osteoporosis without current pathological fracture  Primary hypertension  Vaccine counseling  Screening for hematuria or proteinuria -     Urinalysis, Routine w reflex microscopic  Screening for lipid disorders -     Lipid panel    Follow-up pending labs, yearly for physical

## 2024-05-20 ENCOUNTER — Ambulatory Visit: Payer: Self-pay | Admitting: Medical

## 2024-05-20 ENCOUNTER — Other Ambulatory Visit: Payer: Self-pay | Admitting: Medical

## 2024-05-20 DIAGNOSIS — I10 Essential (primary) hypertension: Secondary | ICD-10-CM

## 2024-05-20 LAB — TSH: TSH: 0.528 u[IU]/mL (ref 0.450–4.500)

## 2024-05-20 LAB — CBC
Hematocrit: 36.3 % (ref 34.0–46.6)
Hemoglobin: 11.6 g/dL (ref 11.1–15.9)
MCH: 30.1 pg (ref 26.6–33.0)
MCHC: 32 g/dL (ref 31.5–35.7)
MCV: 94 fL (ref 79–97)
Platelets: 229 x10E3/uL (ref 150–450)
RBC: 3.85 x10E6/uL (ref 3.77–5.28)
RDW: 13.9 % (ref 11.7–15.4)
WBC: 4 x10E3/uL (ref 3.4–10.8)

## 2024-05-20 LAB — MICROSCOPIC EXAMINATION
Casts: NONE SEEN
Epithelial Cells (non renal): NONE SEEN /HPF (ref 0–10)
RBC, Urine: NONE SEEN /HPF (ref 0–2)
Renal Epithel, UA: NONE SEEN /LPF
WBC, UA: NONE SEEN /HPF (ref 0–5)

## 2024-05-20 LAB — LIPID PANEL
Chol/HDL Ratio: 2.5 ratio (ref 0.0–4.4)
Cholesterol, Total: 187 mg/dL (ref 100–199)
HDL: 76 mg/dL (ref >39)
LDL Chol Calc (NIH): 104 mg/dL — ABNORMAL HIGH (ref 0–99)
Triglycerides: 36 mg/dL (ref 0–149)
VLDL Cholesterol Cal: 7 mg/dL (ref 5–40)

## 2024-05-20 LAB — URINALYSIS, ROUTINE W REFLEX MICROSCOPIC
Bilirubin, UA: NEGATIVE
Glucose, UA: NEGATIVE
Ketones, UA: NEGATIVE
Nitrite, UA: NEGATIVE
Protein,UA: NEGATIVE
RBC, UA: NEGATIVE
Specific Gravity, UA: 1.016 (ref 1.005–1.030)
Urobilinogen, Ur: 0.2 mg/dL (ref 0.2–1.0)
pH, UA: 6 (ref 5.0–7.5)

## 2024-05-20 LAB — COMPREHENSIVE METABOLIC PANEL WITH GFR
ALT: 13 IU/L (ref 0–32)
AST: 16 IU/L (ref 0–40)
Albumin: 4.4 g/dL (ref 3.9–4.9)
Alkaline Phosphatase: 69 IU/L (ref 49–135)
BUN/Creatinine Ratio: 19 (ref 12–28)
BUN: 14 mg/dL (ref 8–27)
Bilirubin Total: 0.3 mg/dL (ref 0.0–1.2)
CO2: 24 mmol/L (ref 20–29)
Calcium: 9.5 mg/dL (ref 8.7–10.3)
Chloride: 106 mmol/L (ref 96–106)
Creatinine, Ser: 0.74 mg/dL (ref 0.57–1.00)
Globulin, Total: 2.2 g/dL (ref 1.5–4.5)
Glucose: 82 mg/dL (ref 70–99)
Potassium: 3.8 mmol/L (ref 3.5–5.2)
Sodium: 141 mmol/L (ref 134–144)
Total Protein: 6.6 g/dL (ref 6.0–8.5)
eGFR: 91 mL/min/1.73 (ref >59)

## 2024-05-20 MED ORDER — AMLODIPINE BESYLATE 5 MG PO TABS: 5.0000 mg | ORAL_TABLET | Freq: Every day | ORAL | 3 refills | Status: AC

## 2024-05-20 MED ORDER — LISINOPRIL-HYDROCHLOROTHIAZIDE 20-12.5 MG PO TABS: 1.0000 | ORAL_TABLET | Freq: Every day | ORAL | 3 refills | Status: AC

## 2024-05-20 NOTE — Progress Notes (Signed)
 Results through MyChart

## 2025-05-25 ENCOUNTER — Encounter: Payer: Self-pay | Admitting: Medical
